# Patient Record
Sex: Female | Born: 1975
Health system: Southern US, Community
[De-identification: ages and names within clinical notes are randomized; demographics above are authoritative.]

## PROBLEM LIST (undated history)

## (undated) DIAGNOSIS — J302 Other seasonal allergic rhinitis: Secondary | ICD-10-CM

## (undated) DIAGNOSIS — D219 Benign neoplasm of connective and other soft tissue, unspecified: Secondary | ICD-10-CM

## (undated) DIAGNOSIS — Z8759 Personal history of other complications of pregnancy, childbirth and the puerperium: Secondary | ICD-10-CM

## (undated) HISTORY — DX: Benign neoplasm of connective and other soft tissue, unspecified: D21.9

## (undated) HISTORY — DX: Other seasonal allergic rhinitis: J30.2

## (undated) HISTORY — DX: Personal history of other complications of pregnancy, childbirth and the puerperium: Z87.59

---

## 2011-04-23 HISTORY — PX: MYOMECTOMY: SHX85

## 2013-04-22 HISTORY — PX: EYE SURGERY: SHX253

## 2015-09-30 DIAGNOSIS — Z87898 Personal history of other specified conditions: Secondary | ICD-10-CM | POA: Insufficient documentation

## 2015-09-30 DIAGNOSIS — IMO0001 Reserved for inherently not codable concepts without codable children: Secondary | ICD-10-CM | POA: Insufficient documentation

## 2015-09-30 DIAGNOSIS — R030 Elevated blood-pressure reading, without diagnosis of hypertension: Secondary | ICD-10-CM

## 2016-06-12 ENCOUNTER — Ambulatory Visit: Payer: Self-pay | Admitting: Family

## 2016-06-20 ENCOUNTER — Encounter: Payer: Self-pay | Admitting: Family

## 2016-06-20 ENCOUNTER — Ambulatory Visit (INDEPENDENT_AMBULATORY_CARE_PROVIDER_SITE_OTHER): Payer: Commercial Managed Care - PPO | Admitting: Family

## 2016-06-20 VITALS — BP 116/66 | HR 94 | Temp 98.6°F | Ht 65.5 in | Wt 177.2 lb

## 2016-06-20 DIAGNOSIS — R635 Abnormal weight gain: Secondary | ICD-10-CM

## 2016-06-20 DIAGNOSIS — D259 Leiomyoma of uterus, unspecified: Secondary | ICD-10-CM

## 2016-06-20 DIAGNOSIS — L709 Acne, unspecified: Secondary | ICD-10-CM | POA: Diagnosis not present

## 2016-06-20 NOTE — Progress Notes (Signed)
Subjective:    Patient ID: Tracy Ingram, female    DOB: 10/07/1975, 41 y.o.   MRN: [id number]  CC: Dzaria Scheckel is a 41 y.o. female who presents today to establish care.    HPI: Uterine fibriods- having heavy periods  Day one is heavy. Has some pain with intercourse based on positioning.  Has established with GYN in [facility]. Pap 2016, normal per patient.   Concerned with Weight gain- about 10 pounds since moved to [place]. Hasn't been excercising.   Mammogram normal per patient , unable to see records.   Acne- improving. Considering coming off of aldactone as making huge difference.  following with dermatology at [facility] in [place].     Will return for CPE, labs.    HISTORY:  No past medical history on file. Past Surgical History:  Procedure Laterality Date  . EYE SURGERY  2015   had nystagmus per patient  . MYOMECTOMY  2013   Family History  Problem Relation Age of Onset  . Fibroids Maternal Aunt   . Breast cancer Neg Hx   . Colon cancer Neg Hx     Allergies: Sulfa antibiotics No current outpatient prescriptions on file prior to visit.   No current facility-administered medications on file prior to visit.     Social History  Substance Use Topics  . Smoking status: Never Smoker  . Smokeless tobacco: Never Used  . Alcohol use Yes    Review of Systems  Constitutional: Negative for chills and fever.  Respiratory: Negative for cough.   Cardiovascular: Negative for chest pain and palpitations.  Gastrointestinal: Negative for nausea and vomiting.  Genitourinary: Positive for vaginal pain. Negative for vaginal discharge.      Objective:    BP 116/66   Pulse 94   Temp 98.6 F (37 C) (Oral)   Ht 5' 5.5" (1.664 m)   Wt 177 lb 3.2 oz (80.4 kg)   LMP 05/30/2016 (Exact Date)   SpO2 99%   BMI 29.04 kg/m  BP Readings from Last 3 Encounters:  06/20/16 116/66   Wt Readings from Last 3 Encounters:  06/20/16 177 lb 3.2 oz (80.4 kg)    Physical  Exam  Constitutional: She appears well-developed and well-nourished.  Eyes: Conjunctivae are normal.  Cardiovascular: Normal rate, regular rhythm, normal heart sounds and normal pulses.   Pulmonary/Chest: Effort normal and breath sounds normal. She has no wheezes. She has no rhonchi. She has no rales.  Neurological: She is alert.  Skin: Skin is warm and dry.  Psychiatric: She has a normal mood and affect. Her speech is normal and behavior is normal. Thought content normal.  Vitals reviewed.      Assessment & Plan:   Problem List Items Addressed This Visit      Musculoskeletal and Integument   Acne    Stable. Following with dermatology. Considering coming off of aldactone.       Relevant Medications   Dapsone 5 % topical gel     Genitourinary   Uterine leiomyoma    Chronic. Follows with GYN in [facility]. Discussed pursuing a transvaginal US to evaluate if fibriods have grown in size sine myomectomy.         Other   Weight gain - Primary    Gaining weight since move to . Discussed lifestyle  Modifications- exercise, low glycemic diet. Will follow.           I am having Ms. Mccathern maintain her tazarotene, Dapsone, and spironolactone.  Meds ordered this encounter  Medications  . tazarotene (TAZORAC) 0.1 % gel    Sig: Apply topically.  . Dapsone 5 % topical gel    Sig: Apply topically.  Marland Kitchen spironolactone (ALDACTONE) 25 MG tablet    Sig: Take 25 mg by mouth daily.    Return precautions given.   Risks, benefits, and alternatives of the medications and treatment plan prescribed today were discussed, and patient expressed understanding.   Education regarding symptom management and diagnosis given to patient on AVS.  Continue to follow with Mable Paris, FNP for routine health maintenance.   Tracy Ingram and I agreed with plan.   Mable Paris, FNP

## 2016-06-20 NOTE — Progress Notes (Signed)
Pre visit review using our clinic review tool, if applicable. No additional management support is needed unless otherwise documented below in the visit note. 

## 2016-06-20 NOTE — Patient Instructions (Addendum)
At check out, schedule physical with fasting  Labs.  Pleasure meeting you!  \

## 2016-06-24 ENCOUNTER — Encounter: Payer: Self-pay | Admitting: Family

## 2016-06-24 DIAGNOSIS — L709 Acne, unspecified: Secondary | ICD-10-CM | POA: Insufficient documentation

## 2016-06-24 DIAGNOSIS — R635 Abnormal weight gain: Secondary | ICD-10-CM | POA: Insufficient documentation

## 2016-06-24 DIAGNOSIS — D251 Intramural leiomyoma of uterus: Secondary | ICD-10-CM | POA: Insufficient documentation

## 2016-06-24 NOTE — Assessment & Plan Note (Addendum)
Chronic. Follows with GYN in [facility]. Discussed pursuing a transvaginal US to evaluate if fibriods have grown in size sine myomectomy.

## 2016-06-24 NOTE — Assessment & Plan Note (Signed)
Gaining weight since move to [place]. Discussed lifestyle  Modifications- exercise, low glycemic diet. Will follow.

## 2016-06-24 NOTE — Assessment & Plan Note (Signed)
Stable. Following with dermatology. Considering coming off of aldactone.

## 2016-08-13 ENCOUNTER — Encounter: Payer: Self-pay | Admitting: Family

## 2016-08-15 ENCOUNTER — Encounter: Payer: Self-pay | Admitting: *Deleted

## 2016-08-15 ENCOUNTER — Ambulatory Visit (INDEPENDENT_AMBULATORY_CARE_PROVIDER_SITE_OTHER): Payer: 59 | Admitting: *Deleted

## 2016-08-15 DIAGNOSIS — Z3201 Encounter for pregnancy test, result positive: Secondary | ICD-10-CM

## 2016-08-15 NOTE — Progress Notes (Signed)
Patient presents to clinic for pregnancy test which is positive. She wants to start care here. Reviewed allergies and meds. Advised to start prenatal vitamins, eat healthy, no smoking drugs or alcohol. Patient voices understanding. To front desk to make initial prenatal appointment.

## 2016-08-16 LAB — POCT PREGNANCY, URINE: Preg Test, Ur: POSITIVE — AB

## 2016-08-19 ENCOUNTER — Encounter: Payer: Self-pay | Admitting: General Practice

## 2016-09-03 ENCOUNTER — Encounter: Payer: Self-pay | Admitting: Emergency Medicine

## 2016-09-03 ENCOUNTER — Emergency Department
Admission: EM | Admit: 2016-09-03 | Discharge: 2016-09-03 | Disposition: A | Discharge status: Home / Self Care | Payer: 59 | Attending: Emergency Medicine | Admitting: Emergency Medicine

## 2016-09-03 ENCOUNTER — Emergency Department: Payer: 59

## 2016-09-03 ENCOUNTER — Encounter: Payer: Self-pay | Admitting: Family

## 2016-09-03 DIAGNOSIS — O039 Complete or unspecified spontaneous abortion without complication: Secondary | ICD-10-CM | POA: Insufficient documentation

## 2016-09-03 DIAGNOSIS — N898 Other specified noninflammatory disorders of vagina: Secondary | ICD-10-CM | POA: Diagnosis present

## 2016-09-03 DIAGNOSIS — O209 Hemorrhage in early pregnancy, unspecified: Secondary | ICD-10-CM | POA: Diagnosis not present

## 2016-09-03 DIAGNOSIS — O208 Other hemorrhage in early pregnancy: Secondary | ICD-10-CM | POA: Diagnosis not present

## 2016-09-03 LAB — BASIC METABOLIC PANEL
ANION GAP: 7 (ref 5–15)
BUN: 8 mg/dL (ref 6–20)
CO2: 23 mmol/L (ref 22–32)
Calcium: 8.4 mg/dL — ABNORMAL LOW (ref 8.9–10.3)
Chloride: 104 mmol/L (ref 101–111)
Creatinine, Ser: 0.58 mg/dL (ref 0.44–1.00)
GFR calc non Af Amer: >60 mL/min (ref >60)
Glucose, Bld: 102 mg/dL — ABNORMAL HIGH (ref 65–99)
Potassium: 3.6 mmol/L (ref 3.5–5.1)
Sodium: 134 mmol/L — ABNORMAL LOW (ref 135–145)

## 2016-09-03 LAB — CBC
HCT: 34.8 % — ABNORMAL LOW (ref 35.0–47.0)
HEMOGLOBIN: 11.7 g/dL — AB (ref 12.0–16.0)
MCH: 27.1 pg (ref 26.0–34.0)
MCHC: 33.7 g/dL (ref 32.0–36.0)
MCV: 80.4 fL (ref 80.0–100.0)
Platelets: 253 10*3/uL (ref 150–440)
RBC: 4.33 MIL/uL (ref 3.80–5.20)
RDW: 15.2 % — ABNORMAL HIGH (ref 11.5–14.5)
WBC: 8.3 10*3/uL (ref 3.6–11.0)

## 2016-09-03 LAB — ANTIBODY SCREEN: ANTIBODY SCREEN: NEGATIVE

## 2016-09-03 LAB — HCG, QUANTITATIVE, PREGNANCY: hCG, Beta Chain, Quant, S: 18577 m[IU]/mL — ABNORMAL HIGH (ref <5)

## 2016-09-03 LAB — POCT PREGNANCY, URINE: Preg Test, Ur: POSITIVE — AB

## 2016-09-03 LAB — ABO/RH: ABO/RH(D): O NEG

## 2016-09-03 MED ORDER — RHO D IMMUNE GLOBULIN 1500 UNIT/2ML IJ SOSY
300.0000 ug | PREFILLED_SYRINGE | Freq: Once | INTRAMUSCULAR | Status: AC
  Administered 2016-09-03: 300 ug via INTRAMUSCULAR
  Filled 2016-09-03: qty 2

## 2016-09-03 NOTE — ED Provider Notes (Signed)
[facility] Emergency Department Provider Note  ____________________________________________  Time seen: Approximately 7:57 AM  I have reviewed the triage vital signs and the nursing notes.   HISTORY  Chief Complaint Vaginal Bleeding   HPI Lorian Yaun is a 41 y.o. female G1P0 at 10 weeks by LMP with h/o uterine fibroids status post myomectomy who presents for evaluation of vaginal bleeding. Patient reports that although she has established care for this pregnancy she has not undergone an ultrasound yet. Today at 2 AM she started having vaginal bleeding and passing very large clots. She denies dizziness, chest pain or shortness of breath. She does endorse lower intermittent mild abdominal cramping associated with the vaginal bleeding. This is patient's first pregnancy. Patient denies any history of bleeding disorder.  History reviewed. No pertinent past medical history.  Patient Active Problem List   Diagnosis Date Noted  . Uterine leiomyoma 06/24/2016  . Acne 06/24/2016  . Weight gain 06/24/2016    Past Surgical History:  Procedure Laterality Date  . EYE SURGERY  2015   had nystagmus per patient  . MYOMECTOMY  2013    Prior to Admission medications   Medication Sig Start Date End Date Taking? Authorizing Provider  Dapsone 5 % topical gel Apply topically.    [provider]  spironolactone (ALDACTONE) 25 MG tablet Take 25 mg by mouth daily.    [provider]  tazarotene (TAZORAC) 0.1 % gel Apply topically.    [provider]    Allergies Sulfa antibiotics  Family History  Problem Relation Age of Onset  . Fibroids Maternal Aunt   . Breast cancer Neg Hx   . Colon cancer Neg Hx     Social History Social History  Substance Use Topics  . Smoking status: Never Smoker  . Smokeless tobacco: Never Used  . Alcohol use Yes    Review of Systems  Constitutional: Negative for fever. Eyes: Negative for visual  changes. ENT: Negative for sore throat. Neck: No neck pain  Cardiovascular: Negative for chest pain. Respiratory: Negative for shortness of breath. Gastrointestinal: + lower abdominal cramping. No vomiting or diarrhea. Genitourinary: Negative for dysuria. + vaginal bleeding Musculoskeletal: Negative for back pain. Skin: Negative for rash. Neurological: Negative for headaches, weakness or numbness. Psych: No SI or HI  ____________________________________________   PHYSICAL EXAM:  VITAL SIGNS: ED Triage Vitals  Enc Vitals Group     BP 09/03/16 0649 (!) 159/82     Pulse Rate 09/03/16 0649 97     Resp 09/03/16 0649 18     Temp 09/03/16 0649 97.9 F (36.6 C)     Temp Source 09/03/16 0649 Oral     SpO2 09/03/16 0649 99 %     Weight --      Height 09/03/16 0650 5\' 6"  (1.676 m)     Head Circumference --      Peak Flow --      Pain Score 09/03/16 0649 2     Pain Loc --      Pain Edu? --      Excl. in [place]? --     Constitutional: Alert and oriented. Well appearing and in no apparent distress. HEENT:      Head: Normocephalic and atraumatic.         Eyes: Conjunctivae are normal. Sclera is non-icteric. EOMI. PERRL      Mouth/Throat: Mucous membranes are moist.       Neck: Supple with no signs of meningismus. Cardiovascular: Regular rate  and rhythm. No murmurs, gallops, or rubs. 2+ symmetrical distal pulses are present in all extremities. No JVD. Respiratory: Normal respiratory effort. Lungs are clear to auscultation bilaterally. No wheezes, crackles, or rhonchi.  Gastrointestinal: Soft, non tender, and non distended with positive bowel sounds. No rebound or guarding. Genitourinary: No CVA tenderness.  Pelvic exam: Normal external genitalia, no rashes or lesions. Large fibroid felt on the anterior portion of the uterus. Os closed with small amount of blood in the vaginal vault. No cervical motion tenderness.  No uterine or adnexal tenderness.   Musculoskeletal: Nontender with normal  range of motion in all extremities. No edema, cyanosis, or erythema of extremities. Neurologic: Normal speech and language. Face is symmetric. Moving all extremities. No gross focal neurologic deficits are appreciated. Skin: Skin is warm, dry and intact. No rash noted. Psychiatric: Mood and affect are normal. Speech and behavior are normal.  ____________________________________________   LABS (all labs ordered are listed, but only abnormal results are displayed)  Labs Reviewed  HCG, QUANTITATIVE, PREGNANCY - Abnormal; Notable for the following:       Result Value   hCG, Beta Chain, Quant, S 18,577 (*)    All other components within normal limits  CBC - Abnormal; Notable for the following:    Hemoglobin 11.7 (*)    HCT 34.8 (*)    RDW 15.2 (*)    All other components within normal limits  BASIC METABOLIC PANEL - Abnormal; Notable for the following:    Sodium 134 (*)    Glucose, Bld 102 (*)    Calcium 8.4 (*)    All other components within normal limits  POCT PREGNANCY, URINE - Abnormal; Notable for the following:    Preg Test, Ur POSITIVE (*)    All other components within normal limits  POC URINE PREG, ED  ABO/RH  ANTIBODY SCREEN  RHOGAM INJECTION   ____________________________________________  EKG  none  ____________________________________________  RADIOLOGY  TVUS: No intrauterine pregnancy identified.  2. Endometrial thickening is noted. Fibroid uterus. ____________________________________________   PROCEDURES  Procedure(s) performed: None Procedures Critical Care performed:  None ____________________________________________   INITIAL IMPRESSION / ASSESSMENT AND PLAN / ED COURSE  41 y.o. female G1P0 at 10 weeks by LMP with h/o uterine fibroids status post myomectomy who presents for evaluation of vaginal bleeding since 2 AM. Differential diagnosis including miscarriage versus bleeding from first trimester. We'll pursue an ultrasound. Hemoglobin 11.7  (baseline 12.4 from 09/2015), vitals stable. Blood type O-, will give Rhogam.     _________________________ 10:10 AM on 09/03/2016 ----------------------------------------- Ultrasound confirmed a complete miscarriage. Patient remains HD stable. Received RhoGAM. Discussed signs and symptoms of acute blood loss anemia and recommended the patient returns to the emergency room if these develop. Pelvic exam showing closed os with small amount of blood in the vaginal vault. Patient has a follow-up appointment with her PCP tomorrow that she plans on keeping it.   Pertinent labs & imaging results that were available during my care of the patient were reviewed by me and considered in my medical decision making (see chart for details).    ____________________________________________   FINAL CLINICAL IMPRESSION(S) / ED DIAGNOSES  Final diagnoses:  Complete miscarriage      NEW MEDICATIONS STARTED DURING THIS VISIT:  New Prescriptions   No medications on file     Note:  This document was prepared using Dragon voice recognition software and may include unintentional dictation errors.    Alfred Levins, [place], MD 09/03/16 1011

## 2016-09-03 NOTE — ED Notes (Signed)
TO US via stretcher.  AAOx3.  Skin warm and dry.  

## 2016-09-03 NOTE — ED Notes (Signed)
AAOx3.  Skin warm and dry.  NAD 

## 2016-09-03 NOTE — ED Triage Notes (Signed)
Patient ambulatory to triage with steady gait, without difficulty or distress noted; pt reports approx [redacted]wks pregnant; pt at [facility] in [place]; G1, [facility] 12/12; reports vag bleeding at 2am upon awakening; st bleeding heavy and has passed 2 large clots with lower abd cramping that began last night

## 2016-09-03 NOTE — ED Notes (Signed)
Patient denies pain and is resting comfortably.  

## 2016-09-04 ENCOUNTER — Encounter: Payer: Self-pay | Admitting: Family

## 2016-09-04 ENCOUNTER — Ambulatory Visit (INDEPENDENT_AMBULATORY_CARE_PROVIDER_SITE_OTHER): Payer: 59 | Admitting: Family

## 2016-09-04 VITALS — BP 126/68 | HR 94 | Temp 98.7°F | Ht 66.0 in | Wt 176.4 lb

## 2016-09-04 DIAGNOSIS — Z0000 Encounter for general adult medical examination without abnormal findings: Secondary | ICD-10-CM | POA: Insufficient documentation

## 2016-09-04 LAB — LIPID PANEL
CHOLESTEROL: 137 mg/dL (ref 0–200)
HDL: 64.7 mg/dL (ref >39.00)
LDL CALC: 64 mg/dL (ref 0–99)
NonHDL: 72.63
Total CHOL/HDL Ratio: 2
Triglycerides: 44 mg/dL (ref 0.0–149.0)
VLDL: 8.8 mg/dL (ref 0.0–40.0)

## 2016-09-04 LAB — COMPREHENSIVE METABOLIC PANEL
ALBUMIN: 3.8 g/dL (ref 3.5–5.2)
ALT: 8 U/L (ref 0–35)
AST: 11 U/L (ref 0–37)
Alkaline Phosphatase: 33 U/L — ABNORMAL LOW (ref 39–117)
BILIRUBIN TOTAL: 0.3 mg/dL (ref 0.2–1.2)
BUN: 9 mg/dL (ref 6–23)
CO2: 29 mEq/L (ref 19–32)
CREATININE: 0.71 mg/dL (ref 0.40–1.20)
Calcium: 8.8 mg/dL (ref 8.4–10.5)
Chloride: 106 mEq/L (ref 96–112)
GFR: 116.92 mL/min (ref >60.00)
Glucose, Bld: 97 mg/dL (ref 70–99)
Potassium: 4 mEq/L (ref 3.5–5.1)
Sodium: 139 mEq/L (ref 135–145)
Total Protein: 6.5 g/dL (ref 6.0–8.3)

## 2016-09-04 LAB — CBC WITH DIFFERENTIAL/PLATELET
BASOS PCT: 0.4 % (ref 0.0–3.0)
Basophils Absolute: 0 10*3/uL (ref 0.0–0.1)
EOS PCT: 2.4 % (ref 0.0–5.0)
Eosinophils Absolute: 0.2 10*3/uL (ref 0.0–0.7)
HEMATOCRIT: 31.6 % — AB (ref 36.0–46.0)
HEMOGLOBIN: 10.5 g/dL — AB (ref 12.0–15.0)
LYMPHS PCT: 16.7 % (ref 12.0–46.0)
Lymphs Abs: 1.7 10*3/uL (ref 0.7–4.0)
MCHC: 33.2 g/dL (ref 30.0–36.0)
MCV: 81.6 fl (ref 78.0–100.0)
Monocytes Absolute: 0.7 10*3/uL (ref 0.1–1.0)
Monocytes Relative: 6.5 % (ref 3.0–12.0)
NEUTROS ABS: 7.6 10*3/uL (ref 1.4–7.7)
Neutrophils Relative %: 74 % (ref 43.0–77.0)
Platelets: 236 10*3/uL (ref 150.0–400.0)
RBC: 3.87 Mil/uL (ref 3.87–5.11)
RDW: 15.6 % — ABNORMAL HIGH (ref 11.5–15.5)
WBC: 10.3 10*3/uL (ref 4.0–10.5)

## 2016-09-04 LAB — RHOGAM INJECTION: UNIT DIVISION: 0

## 2016-09-04 LAB — TSH: TSH: 0.73 u[IU]/mL (ref 0.35–4.50)

## 2016-09-04 LAB — VITAMIN D 25 HYDROXY (VIT D DEFICIENCY, FRACTURES): VITD: 28.08 ng/mL — AB (ref 30.00–100.00)

## 2016-09-04 LAB — HEMOGLOBIN A1C: HEMOGLOBIN A1C: 5.7 % (ref 4.6–6.5)

## 2016-09-04 NOTE — Assessment & Plan Note (Signed)
Will do pap at later date due to miscarriage. Labs today. Encouraged exercise.

## 2016-09-04 NOTE — Progress Notes (Signed)
Pre visit review using our clinic review tool, if applicable. No additional management support is needed unless otherwise documented below in the visit note. 

## 2016-09-04 NOTE — Progress Notes (Signed)
Subjective:    Patient ID: Tracy Ingram, female    DOB: 08-08-1975, 41 y.o.   MRN: [id number]  CC: Keyon Winnick is a 41 y.o. female who presents today for physical exam.    HPI: Overall well today.   Was in the ED yesterday for miscarriage at 10 weeks. Will follow with OB GYN in [facility].   H/o fibroids.     Colorectal Cancer Screening: No family h/o colon cancer Breast Cancer Screening: Mammogram 04/2016 ; normal.  Cervical Cancer Screening: UTD; declines; had a miscarriage yesterday. Will come back or follow up with OB.  Bone Health screening/DEXA for 65+: No increased fracture risk. Defer screening at this time. Lung Cancer Screening: Doesn't have 30 year pack year history and age > 60 years.       Tetanus - utd per  Patient 06/2016         HIV Screening- Candidate for , consents Labs: Screening labs today. Exercise: Gets regular exercise.  Alcohol use: very rare Smoking/tobacco use: Nonsmoker.  Regular dental exams: UTD Wears seat belt: Yes. Skin: no new skin lesions. No h/o skin cancer. Follows with derm.  HISTORY:  No past medical history on file.  Past Surgical History:  Procedure Laterality Date  . EYE SURGERY  2015   had nystagmus per patient  . MYOMECTOMY  2013   Family History  Problem Relation Age of Onset  . Fibroids Maternal Aunt   . Breast cancer Neg Hx   . Colon cancer Neg Hx       ALLERGIES: Sulfa antibiotics  No current outpatient prescriptions on file prior to visit.   No current facility-administered medications on file prior to visit.     Social History  Substance Use Topics  . Smoking status: Never Smoker  . Smokeless tobacco: Never Used  . Alcohol use Yes    Review of Systems  Constitutional: Negative for chills, fever and unexpected weight change.  HENT: Negative for congestion.   Respiratory: Negative for cough.   Cardiovascular: Negative for chest pain, palpitations and leg swelling.  Gastrointestinal:  Negative for nausea and vomiting.  Musculoskeletal: Negative for arthralgias and myalgias.  Skin: Negative for rash.  Neurological: Negative for headaches.  Hematological: Negative for adenopathy.  Psychiatric/Behavioral: Negative for confusion.      Objective:    BP 126/68   Pulse 94   Temp 98.7 F (37.1 C) (Oral)   Ht 5\' 6"  (1.676 m)   Wt 176 lb 6.4 oz (80 kg)   LMP 06/25/2016 Comment: mis carriage [id number]  SpO2 98%   Breastfeeding? No   BMI 28.47 kg/m   BP Readings from Last 3 Encounters:  09/04/16 126/68  09/03/16 (!) 160/80  06/20/16 116/66   Wt Readings from Last 3 Encounters:  09/04/16 176 lb 6.4 oz (80 kg)  06/20/16 177 lb 3.2 oz (80.4 kg)    Physical Exam  Constitutional: She appears well-developed and well-nourished.  Eyes: Conjunctivae are normal.  Neck: No thyroid mass and no thyromegaly present.  Cardiovascular: Normal rate, regular rhythm, normal heart sounds and normal pulses.   Pulmonary/Chest: Effort normal and breath sounds normal. She has no wheezes. She has no rhonchi. She has no rales. Right breast exhibits no inverted nipple, no mass, no nipple discharge, no skin change and no tenderness. Left breast exhibits no inverted nipple, no mass, no nipple discharge, no skin change and no tenderness. Breasts are symmetrical.  CBE performed.   Lymphadenopathy:  Head (right side): No submental, no submandibular, no tonsillar, no preauricular, no posterior auricular and no occipital adenopathy present.       Head (left side): No submental, no submandibular, no tonsillar, no preauricular, no posterior auricular and no occipital adenopathy present.    She has no cervical adenopathy.       Right cervical: No superficial cervical, no deep cervical and no posterior cervical adenopathy present.      Left cervical: No superficial cervical, no deep cervical and no posterior cervical adenopathy present.    She has no axillary adenopathy.  Neurological: She is  alert.  Skin: Skin is warm and dry.  Psychiatric: She has a normal mood and affect. Her speech is normal and behavior is normal. Thought content normal.  Vitals reviewed.      Assessment & Plan:   Problem List Items Addressed This Visit      Other   Routine physical examination - Primary    Will do pap at later date due to miscarriage. Labs today. Encouraged exercise.       Relevant Orders   CBC with Differential/Platelet   Comprehensive metabolic panel   Hemoglobin A1c   HIV antibody   Lipid panel   VITAMIN D 25 Hydroxy (Vit-D Deficiency, Fractures)   TSH       I have discontinued Ms. Beckom's tazarotene, Dapsone, and spironolactone.   No orders of the defined types were placed in this encounter.   Return precautions given.   Risks, benefits, and alternatives of the medications and treatment plan prescribed today were discussed, and patient expressed understanding.   Education regarding symptom management and diagnosis given to patient on AVS.   Continue to follow with Burnard Hawthorne, FNP for routine health maintenance.   Tracy Ingram and I agreed with plan.   Mable Paris, FNP

## 2016-09-04 NOTE — Patient Instructions (Addendum)
Dr Enzo Bi- [facility]  Ensure you either come back to me or your OB for pap smear as you are due.   Labs today    Health Maintenance, Female Adopting a healthy lifestyle and getting preventive care can go a long way to promote health and wellness. Talk with your health care provider about what schedule of regular examinations is right for you. This is a good chance for you to check in with your provider about disease prevention and staying healthy. In between checkups, there are plenty of things you can do on your own. Experts have done a lot of research about which lifestyle changes and preventive measures are most likely to keep you healthy. Ask your health care provider for more information. Weight and diet Eat a healthy diet  Be sure to include plenty of vegetables, fruits, low-fat dairy products, and lean protein.  Do not eat a lot of foods high in solid fats, added sugars, or salt.  Get regular exercise. This is one of the most important things you can do for your health.  Most adults should exercise for at least 150 minutes each week. The exercise should increase your heart rate and make you sweat (moderate-intensity exercise).  Most adults should also do strengthening exercises at least twice a week. This is in addition to the moderate-intensity exercise. Maintain a healthy weight  Body mass index (BMI) is a measurement that can be used to identify possible weight problems. It estimates body fat based on height and weight. Your health care provider can help determine your BMI and help you achieve or maintain a healthy weight.  For females 81 years of age and older:  A BMI below 18.5 is considered underweight.  A BMI of 18.5 to 24.9 is normal.  A BMI of 25 to 29.9 is considered overweight.  A BMI of 30 and above is considered obese. Watch levels of cholesterol and blood lipids  You should start having your blood tested for lipids and cholesterol at 41  years of age, then have this test every 5 years.  You may need to have your cholesterol levels checked more often if:  Your lipid or cholesterol levels are high.  You are older than 41 years of age.  You are at high risk for heart disease. Cancer screening Lung Cancer  Lung cancer screening is recommended for adults 42-19 years old who are at high risk for lung cancer because of a history of smoking.  A yearly low-dose CT scan of the lungs is recommended for people who:  Currently smoke.  Have quit within the past 15 years.  Have at least a 30-pack-year history of smoking. A pack year is smoking an average of one pack of cigarettes a day for 1 year.  Yearly screening should continue until it has been 15 years since you quit.  Yearly screening should stop if you develop a health problem that would prevent you from having lung cancer treatment. Breast Cancer  Practice breast self-awareness. This means understanding how your breasts normally appear and feel.  It also means doing regular breast self-exams. Let your health care provider know about any changes, no matter how small.  If you are in your 20s or 30s, you should have a clinical breast exam (CBE) by a health care provider every 1-3 years as part of a regular health exam.  If you are 83 or older, have a CBE every year. Also consider having a breast X-ray (mammogram) every year.  If you have a family history of breast cancer, talk to your health care provider about genetic screening.  If you are at high risk for breast cancer, talk to your health care provider about having an MRI and a mammogram every year.  Breast cancer gene (BRCA) assessment is recommended for women who have family members with BRCA-related cancers. BRCA-related cancers include:  Breast.  Ovarian.  Tubal.  Peritoneal cancers.  Results of the assessment will determine the need for genetic counseling and BRCA1 and BRCA2 testing. Cervical Cancer   Your health care provider may recommend that you be screened regularly for cancer of the pelvic organs (ovaries, uterus, and vagina). This screening involves a pelvic examination, including checking for microscopic changes to the surface of your cervix (Pap test). You may be encouraged to have this screening done every 3 years, beginning at age 47.  For women ages 55-65, health care providers may recommend pelvic exams and Pap testing every 3 years, or they may recommend the Pap and pelvic exam, combined with testing for human papilloma virus (HPV), every 5 years. Some types of HPV increase your risk of cervical cancer. Testing for HPV may also be done on women of any age with unclear Pap test results.  Other health care providers may not recommend any screening for nonpregnant women who are considered low risk for pelvic cancer and who do not have symptoms. Ask your health care provider if a screening pelvic exam is right for you.  If you have had past treatment for cervical cancer or a condition that could lead to cancer, you need Pap tests and screening for cancer for at least 20 years after your treatment. If Pap tests have been discontinued, your risk factors (such as having a new sexual partner) need to be reassessed to determine if screening should resume. Some women have medical problems that increase the chance of getting cervical cancer. In these cases, your health care provider may recommend more frequent screening and Pap tests. Colorectal Cancer  This type of cancer can be detected and often prevented.  Routine colorectal cancer screening usually begins at 41 years of age and continues through 41 years of age.  Your health care provider may recommend screening at an earlier age if you have risk factors for colon cancer.  Your health care provider may also recommend using home test kits to check for hidden blood in the stool.  A small camera at the end of a tube can be used to examine  your colon directly (sigmoidoscopy or colonoscopy). This is done to check for the earliest forms of colorectal cancer.  Routine screening usually begins at age 63.  Direct examination of the colon should be repeated every 5-10 years through 41 years of age. However, you may need to be screened more often if early forms of precancerous polyps or small growths are found. Skin Cancer  Check your skin from head to toe regularly.  Tell your health care provider about any new moles or changes in moles, especially if there is a change in a mole's shape or color.  Also tell your health care provider if you have a mole that is larger than the size of a Larock eraser.  Always use sunscreen. Apply sunscreen liberally and repeatedly throughout the day.  Protect yourself by wearing long sleeves, pants, a wide-brimmed hat, and sunglasses whenever you are outside. Heart disease, diabetes, and high blood pressure  High blood pressure causes heart disease and increases the risk  of stroke. High blood pressure is more likely to develop in:  People who have blood pressure in the high end of the normal range (130-139/85-89 mm Hg).  People who are overweight or obese.  People who are African American.  If you are 24-83 years of age, have your blood pressure checked every 3-5 years. If you are 53 years of age or older, have your blood pressure checked every year. You should have your blood pressure measured twice-once when you are at a hospital or clinic, and once when you are not at a hospital or clinic. Record the average of the two measurements. To check your blood pressure when you are not at a hospital or clinic, you can use:  An automated blood pressure machine at a pharmacy.  A home blood pressure monitor.  If you are between 15 years and 54 years old, ask your health care provider if you should take aspirin to prevent strokes.  Have regular diabetes screenings. This involves taking a blood sample  to check your fasting blood sugar level.  If you are at a normal weight and have a low risk for diabetes, have this test once every three years after 41 years of age.  If you are overweight and have a high risk for diabetes, consider being tested at a younger age or more often. Preventing infection Hepatitis B  If you have a higher risk for hepatitis B, you should be screened for this virus. You are considered at high risk for hepatitis B if:  You were born in a country where hepatitis B is common. Ask your health care provider which countries are considered high risk.  Your parents were born in a high-risk country, and you have not been immunized against hepatitis B (hepatitis B vaccine).  You have HIV or AIDS.  You use needles to inject street drugs.  You live with someone who has hepatitis B.  You have had sex with someone who has hepatitis B.  You get hemodialysis treatment.  You take certain medicines for conditions, including cancer, organ transplantation, and autoimmune conditions. Hepatitis C  Blood testing is recommended for:  Everyone born from [birth year] through 1965.  Anyone with known risk factors for hepatitis C. Sexually transmitted infections (STIs)  You should be screened for sexually transmitted infections (STIs) including gonorrhea and chlamydia if:  You are sexually active and are younger than 41 years of age.  You are older than 41 years of age and your health care provider tells you that you are at risk for this type of infection.  Your sexual activity has changed since you were last screened and you are at an increased risk for chlamydia or gonorrhea. Ask your health care provider if you are at risk.  If you do not have HIV, but are at risk, it may be recommended that you take a prescription medicine daily to prevent HIV infection. This is called pre-exposure prophylaxis (PrEP). You are considered at risk if:  You are sexually active and do not regularly  use condoms or know the HIV status of your partner(s).  You take drugs by injection.  You are sexually active with a partner who has HIV. Talk with your health care provider about whether you are at high risk of being infected with HIV. If you choose to begin PrEP, you should first be tested for HIV. You should then be tested every 3 months for as long as you are taking PrEP. Pregnancy  If you are  premenopausal and you may become pregnant, ask your health care provider about preconception counseling.  If you may become pregnant, take 400 to 800 micrograms (mcg) of folic acid every day.  If you want to prevent pregnancy, talk to your health care provider about birth control (contraception). Osteoporosis and menopause  Osteoporosis is a disease in which the bones lose minerals and strength with aging. This can result in serious bone fractures. Your risk for osteoporosis can be identified using a bone density scan.  If you are 58 years of age or older, or if you are at risk for osteoporosis and fractures, ask your health care provider if you should be screened.  Ask your health care provider whether you should take a calcium or vitamin D supplement to lower your risk for osteoporosis.  Menopause may have certain physical symptoms and risks.  Hormone replacement therapy may reduce some of these symptoms and risks. Talk to your health care provider about whether hormone replacement therapy is right for you. Follow these instructions at home:  Schedule regular health, dental, and eye exams.  Stay current with your immunizations.  Do not use any tobacco products including cigarettes, chewing tobacco, or electronic cigarettes.  If you are pregnant, do not drink alcohol.  If you are breastfeeding, limit how much and how often you drink alcohol.  Limit alcohol intake to no more than 1 drink per day for nonpregnant women. One drink equals 12 ounces of beer, 5 ounces of wine, or 1 ounces of  hard liquor.  Do not use street drugs.  Do not share needles.  Ask your health care provider for help if you need support or information about quitting drugs.  Tell your health care provider if you often feel depressed.  Tell your health care provider if you have ever been abused or do not feel safe at home. This information is not intended to replace advice given to you by your health care provider. Make sure you discuss any questions you have with your health care provider. Document Released: 10/22/2010 Document Revised: 09/14/2015 Document Reviewed: 01/10/2015 Elsevier Interactive Patient Education  2017 [employer].

## 2016-09-05 LAB — HIV ANTIBODY (ROUTINE TESTING W REFLEX): HIV 1&2 Ab, 4th Generation: NONREACTIVE

## 2016-09-19 ENCOUNTER — Encounter: Payer: 59 | Admitting: Obstetrics & Gynecology

## 2016-09-20 ENCOUNTER — Encounter: Payer: Self-pay | Admitting: Obstetrics and Gynecology

## 2016-09-26 DIAGNOSIS — D225 Melanocytic nevi of trunk: Secondary | ICD-10-CM | POA: Diagnosis not present

## 2016-09-26 DIAGNOSIS — L700 Acne vulgaris: Secondary | ICD-10-CM | POA: Diagnosis not present

## 2016-10-08 ENCOUNTER — Ambulatory Visit (INDEPENDENT_AMBULATORY_CARE_PROVIDER_SITE_OTHER): Payer: 59 | Admitting: Obstetrics and Gynecology

## 2016-10-08 ENCOUNTER — Encounter: Payer: Self-pay | Admitting: Obstetrics and Gynecology

## 2016-10-08 VITALS — BP 120/78 | HR 71 | Ht 66.0 in | Wt 175.6 lb

## 2016-10-08 DIAGNOSIS — D259 Leiomyoma of uterus, unspecified: Secondary | ICD-10-CM | POA: Diagnosis not present

## 2016-10-08 DIAGNOSIS — Z8759 Personal history of other complications of pregnancy, childbirth and the puerperium: Secondary | ICD-10-CM | POA: Diagnosis not present

## 2016-10-08 DIAGNOSIS — Z01419 Encounter for gynecological examination (general) (routine) without abnormal findings: Secondary | ICD-10-CM

## 2016-10-08 DIAGNOSIS — Z87898 Personal history of other specified conditions: Secondary | ICD-10-CM | POA: Diagnosis not present

## 2016-10-08 DIAGNOSIS — Z7689 Persons encountering health services in other specified circumstances: Secondary | ICD-10-CM | POA: Diagnosis not present

## 2016-10-08 NOTE — Progress Notes (Signed)
GYNECOLOGY ANNUAL PHYSICAL EXAM PROGRESS NOTE  Subjective:    Tracy Ingram is a 41 y.o. G51P0010 female who presents to establish care, and for a routine gynecologic examination.  The patient was referred from [facility].  She has relocated from [place] ~ 1 year ago, and has not yet established GYN care. The patient has no complaints today. The patient is sexually active.  The patient wears seatbelts: yes. The patient participates in regular exercise: yes (3 x weekly). Has the patient ever been transfused or tattooed?: no. The patient reports that there is not domestic violence in her life.   The patient notes the following today: 1.  Recent history of miscarriage, occurred last month.  Patient was previously not on any contraception. Was seen in the Emergency Room due to vaginal bleeding and passage of clots. Was ~ [redacted] weeks pregnant. This is what prompted her PCP to refer patient to a GYN.  Gynecologic History  Menarche age: 10 Patient's last menstrual period was 09/28/2016. Period Cycle (Days): 28 Period Duration (Days): 5-7 Menstrual Flow: Moderate, Heavy Menstrual Control: Maxi pad Dysmenorrhea: (!) Mild Dysmenorrhea Symptoms: Cramping, Other (Comment)  Contraception: none History of STI's: Denies Last Pap: 2 years ago. Results were: normal.  Notes h/o abnormal pap smears. Last mammogram: 04/2016. Results were: normal   Obstetric History   G1   P0   T0   P0   A1   L0    SAB1   TAB0   Ectopic0   Multiple0   Live Births0     # Outcome Date GA Lbr Len/2nd Weight Sex Delivery Anes PTL Lv  1 SAB 08/2016 [redacted]w[redacted]d    SAB         Past Medical History:  Diagnosis Date  . Fibroid   . History of miscarriage   . Seasonal allergies     Past Surgical History:  Procedure Laterality Date  . EYE SURGERY  2015   had nystagmus per patient  . MYOMECTOMY  2013    Family History  Problem Relation Age of Onset  . Fibroids Maternal Aunt   . Thyroid disease Mother   .  Breast cancer Neg Hx   . Colon cancer Neg Hx   . Ovarian cancer Neg Hx     Social History   Social History  . Marital status: Married    Spouse name: N/A  . Number of children: N/A  . Years of education: N/A   Occupational History  . Not on file.   Social History Main Topics  . Smoking status: Never Smoker  . Smokeless tobacco: Never Used  . Alcohol use Yes     Comment: rare  . Drug use: No  . Sexual activity: Yes    Birth control/ protection: None   Other Topics Concern  . Not on file   Social History Narrative   From [place]       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   No current facility-administered medications on file prior to visit.     Allergies  Allergen Reactions  . Sulfa Antibiotics     Review of Systems Constitutional: negative for chills, fatigue, fevers and sweats Eyes: negative for irritation, redness and visual disturbance Ears, nose, mouth, throat, and face:  negative for hearing loss, nasal congestion, snoring and tinnitus Respiratory: negative for asthma, cough, sputum Cardiovascular: negative for chest pain, dyspnea, exertional chest pressure/discomfort, irregular heart beat, palpitations and syncope Gastrointestinal: negative for abdominal pain, change in bowel habits, nausea and vomiting Genitourinary: occasional twinge of pain in left lower pelvis. Negative for abnormal menstrual periods, genital lesions, sexual problems and vaginal discharge, dysuria and urinary incontinence Integument/breast: negative for breast lump, breast tenderness and nipple discharge Hematologic/lymphatic: negative for bleeding and easy bruising Musculoskeletal:negative for back pain and muscle weakness Neurological: negative for dizziness, headaches, vertigo and weakness Endocrine: negative for diabetic symptoms including polydipsia, polyuria and  skin dryness Allergic/Immunologic: negative for hay fever and urticaria       Objective:  Blood pressure 120/78, pulse 71, height 5\' 6"  (1.676 m), weight 175 lb 9.6 oz (79.7 kg), last menstrual period 09/28/2016. Body mass index is 28.34 kg/m.     General Appearance:    Alert, cooperative, no distress, appears stated age  Head:    Normocephalic, without obvious abnormality, atraumatic  Eyes:    PERRL, conjunctiva/corneas clear, EOM's intact, both eyes  Ears:    Normal external ear canals, both ears  Nose:   Nares normal, septum midline, mucosa normal, no drainage or sinus tenderness  Throat:   Lips, mucosa, and tongue normal; teeth and gums normal  Neck:   Supple, symmetrical, trachea midline, no adenopathy; thyroid: no enlargement/tenderness/nodules; no carotid bruit or JVD  Back:     Symmetric, no curvature, ROM normal, no CVA tenderness  Lungs:     Clear to auscultation bilaterally, respirations unlabored  Chest Wall:    No tenderness or deformity   Heart:    Regular rate and rhythm, S1 and S2 normal, no murmur, rub or gallop  Breast Exam:    No tenderness, masses, or nipple abnormality  Abdomen:     Soft, non-tender, bowel sounds active all four quadrants, no masses, no organomegaly.    Genitalia:    Pelvic:deferred, as patient currently on menses.   Rectal:    Normal external sphincter.  No hemorrhoids appreciated. Internal exam not done.   Extremities:   Extremities normal, atraumatic, no cyanosis or edema  Pulses:   2+ and symmetric all extremities  Skin:   Skin color, texture, turgor normal, no rashes or lesions  Lymph nodes:   Cervical, supraclavicular, and axillary nodes normal  Neurologic:   CNII-XII intact, normal strength, sensation and reflexes throughout   .  Labs:  Lab Results  Component Value Date   WBC 10.3 09/04/2016   HGB 10.5 (L) 09/04/2016   HCT 31.6 (L) 09/04/2016   MCV 81.6 09/04/2016   PLT 236.0 09/04/2016    Lab Results  Component Value Date    CREATININE 0.71 09/04/2016   BUN 9 09/04/2016   NA 139 09/04/2016   K 4.0 09/04/2016   CL 106 09/04/2016   CO2 29 09/04/2016    Lab Results  Component Value Date   ALT 8 09/04/2016   AST 11 09/04/2016   ALKPHOS 33 (L) 09/04/2016   BILITOT 0.3 09/04/2016    Lab Results  Component Value Date   TSH 0.73 09/04/2016    Lab Results  Component Value Date   CHOL 137 09/04/2016   HDL 64.70 09/04/2016   LDLCALC 64 09/04/2016   TRIG 44.0 09/04/2016   CHOLHDL 2 09/04/2016     Imaging (09/03/2016):  CLINICAL DATA:  Vaginal bleeding.  Cramping.  EXAM: OBSTETRIC <14 WK [place] AND TRANSVAGINAL OB  US  TECHNIQUE: Both transabdominal and transvaginal ultrasound examinations were performed for complete evaluation of the gestation as well as the maternal uterus, adnexal regions, and pelvic cul-de-sac. Transvaginal technique was performed to assess early pregnancy.  COMPARISON:  No prior .  FINDINGS: Intrauterine gestational sac: None visualized.  Yolk sac:  None  Embryo:  None  Cardiac Activity: None  Subchorionic hemorrhage:  None visualized.  Maternal uterus/adnexae: Endometrial thickening is noted. Endometrium measures up to 2.4 cm in diameter. A 4.1 cm fundal fibroid noted. A 3.4 cm anterior fibroid noted. A 1.6 cm submucosal fibroid noted. Ovaries appear unremarkable. No pathologic fluid collections.  IMPRESSION: 1. No intrauterine pregnancy identified.  2. Endometrial thickening is noted. Fibroid uterus.   Assessment:   Healthy female exam, establishing care.  History of miscarriage Fibroid uterus H/o pre-diabetes  Plan:    - Blood tests: None indicated.  Up to date. - Breast self exam technique reviewed and patient encouraged to perform self-exam monthly.  Contraception: none.  Patient notes that she is ambivalant about whether or not future pregnancy occurs. Declines contraception. Understands that she would be a higher risk pregnancy due to  age.  Discussed taking a daily prenatal vitamin and establishing care early if another pregnancy occurs.  - Fibroid uterus, with h/o myomectomy (patient notes ~25 fibroids were removed) in 2013.  - Patient states that she was aware of current fibroids, but not of their size. Discussed that as long as she was relatively asymptomatic, no intervention was required at this time.   Pap smear up to date.  Needs repeat in 1 year.  - Discussed healthy lifestyle modifications. Follow up in 1 year for annual exam, or sooner as needed.     Rubie Maid, MD [facility]

## 2016-10-08 NOTE — Patient Instructions (Addendum)
Health Maintenance, Female Adopting a healthy lifestyle and getting preventive care can go a long way to promote health and wellness. Talk with your health care provider about what schedule of regular examinations is right for you. This is a good chance for you to check in with your provider about disease prevention and staying healthy. In between checkups, there are plenty of things you can do on your own. Experts have done a lot of research about which lifestyle changes and preventive measures are most likely to keep you healthy. Ask your health care provider for more information. Weight and diet Eat a healthy diet  Be sure to include plenty of vegetables, fruits, low-fat dairy products, and lean protein.  Do not eat a lot of foods high in solid fats, added sugars, or salt.  Get regular exercise. This is one of the most important things you can do for your health. ? Most adults should exercise for at least 150 minutes each week. The exercise should increase your heart rate and make you sweat (moderate-intensity exercise). ? Most adults should also do strengthening exercises at least twice a week. This is in addition to the moderate-intensity exercise.  Maintain a healthy weight  Body mass index (BMI) is a measurement that can be used to identify possible weight problems. It estimates body fat based on height and weight. Your health care provider can help determine your BMI and help you achieve or maintain a healthy weight.  For females 20 years of age and older: ? A BMI below 18.5 is considered underweight. ? A BMI of 18.5 to 24.9 is normal. ? A BMI of 25 to 29.9 is considered overweight. ? A BMI of 30 and above is considered obese.  Watch levels of cholesterol and blood lipids  You should start having your blood tested for lipids and cholesterol at 41 years of age, then have this test every 5 years.  You may need to have your cholesterol levels checked more often if: ? Your lipid or  cholesterol levels are high. ? You are older than 41 years of age. ? You are at high risk for heart disease.  Cancer screening Lung Cancer  Lung cancer screening is recommended for adults 55-80 years old who are at high risk for lung cancer because of a history of smoking.  A yearly low-dose CT scan of the lungs is recommended for people who: ? Currently smoke. ? Have quit within the past 15 years. ? Have at least a 30-pack-year history of smoking. A pack year is smoking an average of one pack of cigarettes a day for 1 year.  Yearly screening should continue until it has been 15 years since you quit.  Yearly screening should stop if you develop a health problem that would prevent you from having lung cancer treatment.  Breast Cancer  Practice breast self-awareness. This means understanding how your breasts normally appear and feel.  It also means doing regular breast self-exams. Let your health care provider know about any changes, no matter how small.  If you are in your 20s or 30s, you should have a clinical breast exam (CBE) by a health care provider every 1-3 years as part of a regular health exam.  If you are 40 or older, have a CBE every year. Also consider having a breast X-ray (mammogram) every year.  If you have a family history of breast cancer, talk to your health care provider about genetic screening.  If you are at high risk   for breast cancer, talk to your health care provider about having an MRI and a mammogram every year.  Breast cancer gene (BRCA) assessment is recommended for women who have family members with BRCA-related cancers. BRCA-related cancers include: ? Breast. ? Ovarian. ? Tubal. ? Peritoneal cancers.  Results of the assessment will determine the need for genetic counseling and BRCA1 and BRCA2 testing.  Cervical Cancer Your health care provider may recommend that you be screened regularly for cancer of the pelvic organs (ovaries, uterus, and  vagina). This screening involves a pelvic examination, including checking for microscopic changes to the surface of your cervix (Pap test). You may be encouraged to have this screening done every 3 years, beginning at age 22.  For women ages 56-65, health care providers may recommend pelvic exams and Pap testing every 3 years, or they may recommend the Pap and pelvic exam, combined with testing for human papilloma virus (HPV), every 5 years. Some types of HPV increase your risk of cervical cancer. Testing for HPV may also be done on women of any age with unclear Pap test results.  Other health care providers may not recommend any screening for nonpregnant women who are considered low risk for pelvic cancer and who do not have symptoms. Ask your health care provider if a screening pelvic exam is right for you.  If you have had past treatment for cervical cancer or a condition that could lead to cancer, you need Pap tests and screening for cancer for at least 20 years after your treatment. If Pap tests have been discontinued, your risk factors (such as having a new sexual partner) need to be reassessed to determine if screening should resume. Some women have medical problems that increase the chance of getting cervical cancer. In these cases, your health care provider may recommend more frequent screening and Pap tests.  Colorectal Cancer  This type of cancer can be detected and often prevented.  Routine colorectal cancer screening usually begins at 41 years of age and continues through 41 years of age.  Your health care provider may recommend screening at an earlier age if you have risk factors for colon cancer.  Your health care provider may also recommend using home test kits to check for hidden blood in the stool.  A small camera at the end of a tube can be used to examine your colon directly (sigmoidoscopy or colonoscopy). This is done to check for the earliest forms of colorectal  cancer.  Routine screening usually begins at age 33.  Direct examination of the colon should be repeated every 5-10 years through 41 years of age. However, you may need to be screened more often if early forms of precancerous polyps or small growths are found.  Skin Cancer  Check your skin from head to toe regularly.  Tell your health care provider about any new moles or changes in moles, especially if there is a change in a mole's shape or color.  Also tell your health care provider if you have a mole that is larger than the size of a Dimitroff eraser.  Always use sunscreen. Apply sunscreen liberally and repeatedly throughout the day.  Protect yourself by wearing long sleeves, pants, a wide-brimmed hat, and sunglasses whenever you are outside.  Heart disease, diabetes, and high blood pressure  High blood pressure causes heart disease and increases the risk of stroke. High blood pressure is more likely to develop in: ? People who have blood pressure in the high end of  the normal range (130-139/85-89 mm Hg). ? People who are overweight or obese. ? People who are African American.  If you are 21-29 years of age, have your blood pressure checked every 3-5 years. If you are 3 years of age or older, have your blood pressure checked every year. You should have your blood pressure measured twice-once when you are at a hospital or clinic, and once when you are not at a hospital or clinic. Record the average of the two measurements. To check your blood pressure when you are not at a hospital or clinic, you can use: ? An automated blood pressure machine at a pharmacy. ? A home blood pressure monitor.  If you are between 17 years and 37 years old, ask your health care provider if you should take aspirin to prevent strokes.  Have regular diabetes screenings. This involves taking a blood sample to check your fasting blood sugar level. ? If you are at a normal weight and have a low risk for diabetes,  have this test once every three years after 41 years of age. ? If you are overweight and have a high risk for diabetes, consider being tested at a younger age or more often. Preventing infection Hepatitis B  If you have a higher risk for hepatitis B, you should be screened for this virus. You are considered at high risk for hepatitis B if: ? You were born in a country where hepatitis B is common. Ask your health care provider which countries are considered high risk. ? Your parents were born in a high-risk country, and you have not been immunized against hepatitis B (hepatitis B vaccine). ? You have HIV or AIDS. ? You use needles to inject street drugs. ? You live with someone who has hepatitis B. ? You have had sex with someone who has hepatitis B. ? You get hemodialysis treatment. ? You take certain medicines for conditions, including cancer, organ transplantation, and autoimmune conditions.  Hepatitis C  Blood testing is recommended for: ? Everyone born from [birth year] through 1965. ? Anyone with known risk factors for hepatitis C.  Sexually transmitted infections (STIs)  You should be screened for sexually transmitted infections (STIs) including gonorrhea and chlamydia if: ? You are sexually active and are younger than 41 years of age. ? You are older than 41 years of age and your health care provider tells you that you are at risk for this type of infection. ? Your sexual activity has changed since you were last screened and you are at an increased risk for chlamydia or gonorrhea. Ask your health care provider if you are at risk.  If you do not have HIV, but are at risk, it may be recommended that you take a prescription medicine daily to prevent HIV infection. This is called pre-exposure prophylaxis (PrEP). You are considered at risk if: ? You are sexually active and do not regularly use condoms or know the HIV status of your partner(s). ? You take drugs by injection. ? You are  sexually active with a partner who has HIV.  Talk with your health care provider about whether you are at high risk of being infected with HIV. If you choose to begin PrEP, you should first be tested for HIV. You should then be tested every 3 months for as long as you are taking PrEP. Pregnancy  If you are premenopausal and you may become pregnant, ask your health care provider about preconception counseling.  If you may become  pregnant, take 400 to 800 micrograms (mcg) of folic acid every day.  If you want to prevent pregnancy, talk to your health care provider about birth control (contraception). Osteoporosis and menopause  Osteoporosis is a disease in which the bones lose minerals and strength with aging. This can result in serious bone fractures. Your risk for osteoporosis can be identified using a bone density scan.  If you are 65 years of age or older, or if you are at risk for osteoporosis and fractures, ask your health care provider if you should be screened.  Ask your health care provider whether you should take a calcium or vitamin D supplement to lower your risk for osteoporosis.  Menopause may have certain physical symptoms and risks.  Hormone replacement therapy may reduce some of these symptoms and risks. Talk to your health care provider about whether hormone replacement therapy is right for you. Follow these instructions at home:  Schedule regular health, dental, and eye exams.  Stay current with your immunizations.  Do not use any tobacco products including cigarettes, chewing tobacco, or electronic cigarettes.  If you are pregnant, do not drink alcohol.  If you are breastfeeding, limit how much and how often you drink alcohol.  Limit alcohol intake to no more than 1 drink per day for nonpregnant women. One drink equals 12 ounces of beer, 5 ounces of wine, or 1 ounces of hard liquor.  Do not use street drugs.  Do not share needles.  Ask your health care  provider for help if you need support or information about quitting drugs.  Tell your health care provider if you often feel depressed.  Tell your health care provider if you have ever been abused or do not feel safe at home. This information is not intended to replace advice given to you by your health care provider. Make sure you discuss any questions you have with your health care provider. Document Released: 10/22/2010 Document Revised: 09/14/2015 Document Reviewed: 01/10/2015 Elsevier Interactive Patient Education  2018 [employer]       Preparing for Pregnancy If you are considering becoming pregnant, make an appointment to see your regular health care provider to learn how to prepare for a safe and healthy pregnancy (preconception care). During a preconception care visit, your health care provider will:  Do a complete physical exam, including a Pap test.  Take a complete medical history.  Give you information, answer your questions, and help you resolve problems.  Preconception checklist Medical history  Tell your health care provider about any current or past medical conditions. Your pregnancy or your ability to become pregnant may be affected by chronic conditions, such as diabetes, chronic hypertension, and thyroid problems.  Include your family's medical history as well as your partner's medical history.  Tell your health care provider about any history of STIs (sexually transmitted infections).These can affect your pregnancy. In some cases, they can be passed to your baby. Discuss any concerns that you have about STIs.  If indicated, discuss the benefits of genetic testing. This testing will show whether there are any genetic conditions that may be passed from you or your partner to your baby.  Tell your health care provider about: ? Any problems you have had with conception or pregnancy. ? Any medicines you take. These include vitamins, herbal supplements, and  over-the-counter medicines. ? Your history of immunizations. Discuss any vaccinations that you may need.  Diet  Ask your health care provider what to include in a healthy   diet that has a balance of nutrients. This is especially important when you are pregnant or preparing to become pregnant.  Ask your health care provider to help you reach a healthy weight before pregnancy. ? If you are overweight, you may be at higher risk for certain complications, such as high blood pressure, diabetes, and preterm birth. ? If you are underweight, you are more likely to have a baby who has a low birth weight.  Lifestyle, work, and home  Let your health care provider know: ? About any lifestyle habits that you have, such as alcohol use, drug use, or smoking. ? About recreational activities that may put you at risk during pregnancy, such as downhill skiing and certain exercise programs. ? Tell your health care provider about any international travel, especially any travel to places with an active Zika virus outbreak. ? About harmful substances that you may be exposed to at work or at home. These include chemicals, pesticides, radiation, or even litter boxes. ? If you do not feel safe at home.  Mental health  Tell your health care provider about: ? Any history of mental health conditions, including feelings of depression, sadness, or anxiety. ? Any medicines that you take for a mental health condition. These include herbs and supplements.  Home instructions to prepare for pregnancy Lifestyle  Eat a balanced diet. This includes fresh fruits and vegetables, whole grains, lean meats, low-fat dairy products, healthy fats, and foods that are high in fiber. Ask to meet with a nutritionist or registered dietitian for assistance with meal planning and goals.  Get regular exercise. Try to be active for at least 30 minutes a day on most days of the week. Ask your health care provider which activities are safe  during pregnancy.  Do not use any products that contain nicotine or tobacco, such as cigarettes and e-cigarettes. If you need help quitting, ask your health care provider.  Do not drink alcohol.  Do not take illegal drugs.  Maintain a healthy weight. Ask your health care provider what weight range is right for you.  General instructions  Keep an accurate record of your menstrual periods. This makes it easier for your health care provider to determine your baby's due date.  Begin taking prenatal vitamins and folic acid supplements daily as directed by your health care provider.  Manage any chronic conditions, such as high blood pressure and diabetes, as told by your health care provider. This is important.  How do I know that I am pregnant? You may be pregnant if you have been sexually active and you miss your period. Symptoms of early pregnancy include:  Mild cramping.  Very light vaginal bleeding (spotting).  Feeling unusually tired.  Nausea and vomiting (morning sickness).  If you have any of these symptoms and you suspect that you might be pregnant, you can take a home pregnancy test. These tests check for a hormone in your urine (human chorionic gonadotropin, or hCG). A woman's body begins to make this hormone during early pregnancy. These tests are very accurate. Wait until at least the first day after you miss your period to take one. If the test shows that you are pregnant (you get a positive result), call your health care provider to make an appointment for prenatal care. What should I do if I become pregnant?  Make an appointment with your health care provider as soon as you suspect you are pregnant.  Do not use any products that contain nicotine, such as   If you need help quitting, ask your health care provider.  Do not drink alcoholic beverages. Alcohol is related to a number of birth defects.  Avoid toxic odors and  chemicals.  You may continue to have sexual intercourse if it does not cause pain or other problems, such as vaginal bleeding. This information is not intended to replace advice given to you by your health care provider. Make sure you discuss any questions you have with your health care provider. Document Released: 03/21/2008 Document Revised: 12/05/2015 Document Reviewed: 10/29/2015 Elsevier Interactive Patient Education  2017 [employer].

## 2017-05-13 ENCOUNTER — Ambulatory Visit (INDEPENDENT_AMBULATORY_CARE_PROVIDER_SITE_OTHER): Payer: 59 | Admitting: Obstetrics and Gynecology

## 2017-05-13 ENCOUNTER — Encounter: Payer: Self-pay | Admitting: Obstetrics and Gynecology

## 2017-05-13 ENCOUNTER — Ambulatory Visit (INDEPENDENT_AMBULATORY_CARE_PROVIDER_SITE_OTHER): Payer: 59

## 2017-05-13 VITALS — BP 123/74 | HR 81 | Ht 66.0 in | Wt 174.2 lb

## 2017-05-13 DIAGNOSIS — Z1231 Encounter for screening mammogram for malignant neoplasm of breast: Secondary | ICD-10-CM | POA: Diagnosis not present

## 2017-05-13 DIAGNOSIS — Z01419 Encounter for gynecological examination (general) (routine) without abnormal findings: Secondary | ICD-10-CM | POA: Diagnosis not present

## 2017-05-13 DIAGNOSIS — N920 Excessive and frequent menstruation with regular cycle: Secondary | ICD-10-CM | POA: Diagnosis not present

## 2017-05-13 DIAGNOSIS — Z124 Encounter for screening for malignant neoplasm of cervix: Secondary | ICD-10-CM

## 2017-05-13 DIAGNOSIS — R350 Frequency of micturition: Secondary | ICD-10-CM | POA: Diagnosis not present

## 2017-05-13 LAB — POCT URINALYSIS DIPSTICK
BILIRUBIN UA: NEGATIVE
Blood, UA: NEGATIVE
GLUCOSE UA: NEGATIVE
KETONES UA: NEGATIVE
Leukocytes, UA: NEGATIVE
Nitrite, UA: NEGATIVE
PROTEIN UA: NEGATIVE
Spec Grav, UA: 1.015 (ref 1.010–1.025)
Urobilinogen, UA: 0.2 E.U./dL
pH, UA: 6.5 (ref 5.0–8.0)

## 2017-05-13 NOTE — Progress Notes (Addendum)
GYNECOLOGY ANNUAL PHYSICAL EXAM PROGRESS NOTE  Subjective:    Tracy Ingram is a 42 y.o. [id number] female with h/o fibroid uterus who presents for a routine gynecologic examination.  The patient is sexually active.  The patient wears seatbelts: yes. The patient participates in regular exercise: yes (3-4 x weekly). Has the patient ever been transfused or tattooed?: no. The patient reports that there is not domestic violence in her life.   The patient notes the following today: 1.  Patient notes that periods have become heavier. Has always had heavy periods but notes that gradually they have become even heavier. Periods normally last 7 days. Is using super pads for the first -3-4 days changing q 2-3 hours, then gets lighter. Denies dysmenorrhea.  2. Urinary frequency. Denies hematuria or dysuria  Gynecologic History  Menarche age: 36 Patient's last menstrual period was 04/16/2017. Contraception: none History of STI's: Denies Last Pap: 3 years ago. Results were: normal.  Notes h/o abnormal pap smears. Last mammogram: 04/2016. Results were: normal   Obstetric History   G1   P0   T0   P0   A1   L0    SAB1   TAB0   Ectopic0   Multiple0   Live Births0     # Outcome Date GA Lbr Len/2nd Weight Sex Delivery Anes PTL Lv  1 SAB 08/2016 [redacted]w[redacted]d    SAB         Past Medical History:  Diagnosis Date  . Fibroid   . History of miscarriage   . Seasonal allergies     Past Surgical History:  Procedure Laterality Date  . EYE SURGERY  2015   had nystagmus per patient  . MYOMECTOMY  2013    Family History  Problem Relation Age of Onset  . Fibroids Maternal Aunt   . Thyroid disease Mother   . Breast cancer Neg Hx   . Colon cancer Neg Hx   . Ovarian cancer Neg Hx     Social History   Socioeconomic History  . Marital status: Married    Spouse name: Not on file  . Number of children: Not on file  . Years of education: Not on file  . Highest education level: Not on file  Social  Needs  . Financial resource strain: Not on file  . Food insecurity - worry: Not on file  . Food insecurity - inability: Not on file  . Transportation needs - medical: Not on file  . Transportation needs - non-medical: Not on file  Occupational History  . Not on file  Tobacco Use  . Smoking status: Never Smoker  . Smokeless tobacco: Never Used  Substance and Sexual Activity  . Alcohol use: Yes    Comment: rare  . Drug use: No  . Sexual activity: Yes    Birth control/protection: None  Other Topics Concern  . Not on file  Social History Narrative   From [place].       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 Multiple Vitamin (MULTIVITAMIN) tablet Take 1 tablet by mouth daily.    . Dapsone (ACZONE) 5 % topical gel Apply topically 2 (two) times daily.    . tazarotene (TAZORAC) 0.05 % cream Apply topically at bedtime.  No current facility-administered medications on file prior to visit.     Allergies  Allergen Reactions  . Sulfa Antibiotics     Review of Systems Constitutional: negative for chills, fatigue, fevers and sweats Eyes: negative for irritation, redness and visual disturbance Ears, nose, mouth, throat, and face: negative for hearing loss, nasal congestion, snoring and tinnitus Respiratory: negative for asthma, cough, sputum Cardiovascular: negative for chest pain, dyspnea, exertional chest pressure/discomfort, irregular heart beat, palpitations and syncope Gastrointestinal: negative for abdominal pain, change in bowel habits, nausea and vomiting Genitourinary:  Positive for abnormal menstrual periods (see HPI), pelvic pressure, urinary frequency, and decreased libido. Negative for  genital lesions, sexual problems and vaginal discharge, dysuria and urinary incontinence Integument/breast: negative for breast lump, breast  tenderness and nipple discharge Hematologic/lymphatic: negative for bleeding and easy bruising Musculoskeletal:negative for back pain and muscle weakness Neurological: negative for dizziness, headaches, vertigo and weakness Endocrine: negative for diabetic symptoms including polydipsia, polyuria and skin dryness Allergic/Immunologic: negative for hay fever and urticaria       Objective:  Blood pressure 123/74, pulse 81, height 5\' 6"  (1.676 m), weight 174 lb 3.2 oz (79 kg), last menstrual period 04/16/2017. Body mass index is 28.12 kg/m.   General Appearance:    Alert, cooperative, no distress, appears stated age  Head:    Normocephalic, without obvious abnormality, atraumatic  Eyes:    PERRL, conjunctiva/corneas clear, EOM's intact, both eyes  Ears:    Normal external ear canals, both ears  Nose:   Nares normal, septum midline, mucosa normal, no drainage or sinus tenderness  Throat:   Lips, mucosa, and tongue normal; teeth and gums normal  Neck:   Supple, symmetrical, trachea midline, no adenopathy; thyroid: no enlargement/tenderness/nodules; no carotid bruit or JVD  Back:     Symmetric, no curvature, ROM normal, no CVA tenderness  Lungs:     Clear to auscultation bilaterally, respirations unlabored  Chest Wall:    No tenderness or deformity   Heart:    Regular rate and rhythm, S1 and S2 normal, no murmur, rub or gallop  Breast Exam:    No tenderness, masses, or nipple abnormality  Abdomen:     Soft, non-tender, bowel sounds active all four quadrants, no masses, no organomegaly.    Genitalia:    External genitalia normal, rectovaginal septum normal.  Vagina with moderate amount of thin white discharge, no odor.  Cervix normal appearing, no lesions and no motion tenderness.     Uterus slightly enlarged with irregular contours, 12-14 week size.  Mobile, nontender.  Adnexae non-palpable, nontender bilaterally.   Rectal:    Normal external sphincter.  No hemorrhoids appreciated. Internal exam  not done.   Extremities:   Extremities normal, atraumatic, no cyanosis or edema  Pulses:   2+ and symmetric all extremities  Skin:   Skin color, texture, turgor normal, no rashes or lesions  Lymph nodes:   Cervical, supraclavicular, and axillary nodes normal  Neurologic:   CNII-XII intact, normal strength, sensation and reflexes throughout   .  Labs:  Lab Results  Component Value Date   WBC 10.3 09/04/2016   HGB 10.5 (L) 09/04/2016   HCT 31.6 (L) 09/04/2016   MCV 81.6 09/04/2016   PLT 236.0 09/04/2016    Lab Results  Component Value Date   CREATININE 0.71 09/04/2016   BUN 9 09/04/2016   NA 139 09/04/2016   K 4.0 09/04/2016   CL 106 09/04/2016   CO2 29 09/04/2016    Lab Results  Component Value Date   ALT 8 09/04/2016   AST 11 09/04/2016   ALKPHOS 33 (L) 09/04/2016   BILITOT 0.3 09/04/2016    Lab Results  Component Value Date   TSH 0.73 09/04/2016    Lab Results  Component Value Date   CHOL 137 09/04/2016   HDL 64.70 09/04/2016   LDLCALC 64 09/04/2016   TRIG 44.0 09/04/2016   CHOLHDL 2 09/04/2016     Imaging (09/03/2016):  CLINICAL DATA:  Vaginal bleeding.  Cramping.  EXAM: OBSTETRIC <14 WK [place] AND TRANSVAGINAL OB US  TECHNIQUE: Both transabdominal and transvaginal ultrasound examinations were performed for complete evaluation of the gestation as well as the maternal uterus, adnexal regions, and pelvic cul-de-sac. Transvaginal technique was performed to assess early pregnancy.  COMPARISON:  No prior .  FINDINGS: Intrauterine gestational sac: None visualized.  Yolk sac:  None  Embryo:  None  Cardiac Activity: None  Subchorionic hemorrhage:  None visualized.  Maternal uterus/adnexae: Endometrial thickening is noted. Endometrium measures up to 2.4 cm in diameter. A 4.1 cm fundal fibroid noted. A 3.4 cm anterior fibroid noted. A 1.6 cm submucosal fibroid noted. Ovaries appear unremarkable. No pathologic  fluid collections.  IMPRESSION: 1. No intrauterine pregnancy identified.  2. Endometrial thickening is noted. Fibroid uterus.    Results for orders placed or performed in visit on 05/13/17  POCT urinalysis dipstick  Result Value Ref Range   Color, UA yellow    Clarity, UA clear    Glucose, UA neg    Bilirubin, UA neg    Ketones, UA neg    Spec Grav, UA 1.015 1.010 - 1.025   Blood, UA neg    pH, UA 6.5 5.0 - 8.0   Protein, UA neg    Urobilinogen, UA 0.2 0.2 or 1.0 E.U./dL   Nitrite, UA neg    Leukocytes, UA Negative Negative   Appearance     Odor       Assessment:   Routine gynecologic exam.  Fibroid uterus Menorrhagia Decreased libido Dysuria  Plan:    - Blood tests: FSH/LH, Estradiol, Testosterone, Progesterone.  Will have annual labs performed by PCP in 1 month.  - Breast self exam technique reviewed and patient encouraged to perform self-exam monthly.  Contraception: none.  Patient is ambivalent regarding pregnancy. Discussed hormonal regimen may be indicated for other symptoms such as menorrhagia and fibroids. Patient notes understanding.  - Pap smear performed today.  - Discussed healthy lifestyle modifications. - Menorrhagia worsening. Briefly discussed management options for abnormal uterine bleeding including tranexamic acid (Lysteda), oral progesterone (Megace), Depo Provera, Mirena IUD, endometrial ablation (Novasure/Hydrothermal Ablation) or hysterectomy as definitive surgical management.  Discussed risks and benefits of each method. Will discuss further at next visit after ultrasound performed.  - Fibroid uterus, with h/o myomectomy (patient notes ~25 fibroids were removed) in 2013.  Last ultrasound in 2018 notes largest fibroid 4.1 cm.  Patient now with change in menses, heavier cycles. Uterine enlargement noted on today's exam. Will repeat to assess if change in size or number of fibroids.  - Decreased libido may be due to change in hormonal status or  hormonal imbalance. May be helped with hormonal supplementation. To discuss further next visit.  - Urinary frequency, with normal UA today. May be secondary to pressure symptoms from enlarged uterus.  Follow up in 1 year for annual exam, or sooner as needed.  To f/u in 2-3 weeks after ultrasound to discuss further management.    Rubie Maid, MD [facility]      Rubie Maid, MD [facility]

## 2017-05-13 NOTE — Patient Instructions (Addendum)
Health Maintenance, Female Adopting a healthy lifestyle and getting preventive care can go a long way to promote health and wellness. Talk with your health care provider about what schedule of regular examinations is right for you. This is a good chance for you to check in with your provider about disease prevention and staying healthy. In between checkups, there are plenty of things you can do on your own. Experts have done a lot of research about which lifestyle changes and preventive measures are most likely to keep you healthy. Ask your health care provider for more information. Weight and diet Eat a healthy diet  Be sure to include plenty of vegetables, fruits, low-fat dairy products, and lean protein.  Do not eat a lot of foods high in solid fats, added sugars, or salt.  Get regular exercise. This is one of the most important things you can do for your health. ? Most adults should exercise for at least 150 minutes each week. The exercise should increase your heart rate and make you sweat (moderate-intensity exercise). ? Most adults should also do strengthening exercises at least twice a week. This is in addition to the moderate-intensity exercise.  Maintain a healthy weight  Body mass index (BMI) is a measurement that can be used to identify possible weight problems. It estimates body fat based on height and weight. Your health care provider can help determine your BMI and help you achieve or maintain a healthy weight.  For females 20 years of age and older: ? A BMI below 18.5 is considered underweight. ? A BMI of 18.5 to 24.9 is normal. ? A BMI of 25 to 29.9 is considered overweight. ? A BMI of 30 and above is considered obese.  Watch levels of cholesterol and blood lipids  You should start having your blood tested for lipids and cholesterol at 42 years of age, then have this test every 5 years.  You may need to have your cholesterol levels checked more often if: ? Your lipid or  cholesterol levels are high. ? You are older than 42 years of age. ? You are at high risk for heart disease.  Cancer screening Lung Cancer  Lung cancer screening is recommended for adults 55-80 years old who are at high risk for lung cancer because of a history of smoking.  A yearly low-dose CT scan of the lungs is recommended for people who: ? Currently smoke. ? Have quit within the past 15 years. ? Have at least a 30-pack-year history of smoking. A pack year is smoking an average of one pack of cigarettes a day for 1 year.  Yearly screening should continue until it has been 15 years since you quit.  Yearly screening should stop if you develop a health problem that would prevent you from having lung cancer treatment.  Breast Cancer  Practice breast self-awareness. This means understanding how your breasts normally appear and feel.  It also means doing regular breast self-exams. Let your health care provider know about any changes, no matter how small.  If you are in your 20s or 30s, you should have a clinical breast exam (CBE) by a health care provider every 1-3 years as part of a regular health exam.  If you are 40 or older, have a CBE every year. Also consider having a breast X-ray (mammogram) every year.  If you have a family history of breast cancer, talk to your health care provider about genetic screening.  If you are at high risk   for breast cancer, talk to your health care provider about having an MRI and a mammogram every year.  Breast cancer gene (BRCA) assessment is recommended for women who have family members with BRCA-related cancers. BRCA-related cancers include: ? Breast. ? Ovarian. ? Tubal. ? Peritoneal cancers.  Results of the assessment will determine the need for genetic counseling and BRCA1 and BRCA2 testing.  Cervical Cancer Your health care provider may recommend that you be screened regularly for cancer of the pelvic organs (ovaries, uterus, and  vagina). This screening involves a pelvic examination, including checking for microscopic changes to the surface of your cervix (Pap test). You may be encouraged to have this screening done every 3 years, beginning at age 22.  For women ages 56-65, health care providers may recommend pelvic exams and Pap testing every 3 years, or they may recommend the Pap and pelvic exam, combined with testing for human papilloma virus (HPV), every 5 years. Some types of HPV increase your risk of cervical cancer. Testing for HPV may also be done on women of any age with unclear Pap test results.  Other health care providers may not recommend any screening for nonpregnant women who are considered low risk for pelvic cancer and who do not have symptoms. Ask your health care provider if a screening pelvic exam is right for you.  If you have had past treatment for cervical cancer or a condition that could lead to cancer, you need Pap tests and screening for cancer for at least 20 years after your treatment. If Pap tests have been discontinued, your risk factors (such as having a new sexual partner) need to be reassessed to determine if screening should resume. Some women have medical problems that increase the chance of getting cervical cancer. In these cases, your health care provider may recommend more frequent screening and Pap tests.  Colorectal Cancer  This type of cancer can be detected and often prevented.  Routine colorectal cancer screening usually begins at 42 years of age and continues through 42 years of age.  Your health care provider may recommend screening at an earlier age if you have risk factors for colon cancer.  Your health care provider may also recommend using home test kits to check for hidden blood in the stool.  A small camera at the end of a tube can be used to examine your colon directly (sigmoidoscopy or colonoscopy). This is done to check for the earliest forms of colorectal  cancer.  Routine screening usually begins at age 33.  Direct examination of the colon should be repeated every 5-10 years through 43 years of age. However, you may need to be screened more often if early forms of precancerous polyps or small growths are found.  Skin Cancer  Check your skin from head to toe regularly.  Tell your health care provider about any new moles or changes in moles, especially if there is a change in a mole's shape or color.  Also tell your health care provider if you have a mole that is larger than the size of a [place] eraser.  Always use sunscreen. Apply sunscreen liberally and repeatedly throughout the day.  Protect yourself by wearing long sleeves, pants, a wide-brimmed hat, and sunglasses whenever you are outside.  Heart disease, diabetes, and high blood pressure  High blood pressure causes heart disease and increases the risk of stroke. High blood pressure is more likely to develop in: ? People who have blood pressure in the high end of  the normal range (130-139/85-89 mm Hg). ? People who are overweight or obese. ? People who are African American.  If you are 21-29 years of age, have your blood pressure checked every 3-5 years. If you are 3 years of age or older, have your blood pressure checked every year. You should have your blood pressure measured twice-once when you are at a hospital or clinic, and once when you are not at a hospital or clinic. Record the average of the two measurements. To check your blood pressure when you are not at a hospital or clinic, you can use: ? An automated blood pressure machine at a pharmacy. ? A home blood pressure monitor.  If you are between 17 years and 37 years old, ask your health care provider if you should take aspirin to prevent strokes.  Have regular diabetes screenings. This involves taking a blood sample to check your fasting blood sugar level. ? If you are at a normal weight and have a low risk for diabetes,  have this test once every three years after 42 years of age. ? If you are overweight and have a high risk for diabetes, consider being tested at a younger age or more often. Preventing infection Hepatitis B  If you have a higher risk for hepatitis B, you should be screened for this virus. You are considered at high risk for hepatitis B if: ? You were born in a country where hepatitis B is common. Ask your health care provider which countries are considered high risk. ? Your parents were born in a high-risk country, and you have not been immunized against hepatitis B (hepatitis B vaccine). ? You have HIV or AIDS. ? You use needles to inject street drugs. ? You live with someone who has hepatitis B. ? You have had sex with someone who has hepatitis B. ? You get hemodialysis treatment. ? You take certain medicines for conditions, including cancer, organ transplantation, and autoimmune conditions.  Hepatitis C  Blood testing is recommended for: ? Everyone born from [birth year] through 1965. ? Anyone with known risk factors for hepatitis C.  Sexually transmitted infections (STIs)  You should be screened for sexually transmitted infections (STIs) including gonorrhea and chlamydia if: ? You are sexually active and are younger than 42 years of age. ? You are older than 42 years of age and your health care provider tells you that you are at risk for this type of infection. ? Your sexual activity has changed since you were last screened and you are at an increased risk for chlamydia or gonorrhea. Ask your health care provider if you are at risk.  If you do not have HIV, but are at risk, it may be recommended that you take a prescription medicine daily to prevent HIV infection. This is called pre-exposure prophylaxis (PrEP). You are considered at risk if: ? You are sexually active and do not regularly use condoms or know the HIV status of your partner(s). ? You take drugs by injection. ? You are  sexually active with a partner who has HIV.  Talk with your health care provider about whether you are at high risk of being infected with HIV. If you choose to begin PrEP, you should first be tested for HIV. You should then be tested every 3 months for as long as you are taking PrEP. Pregnancy  If you are premenopausal and you may become pregnant, ask your health care provider about preconception counseling.  If you may become  pregnant, take 400 to 800 micrograms (mcg) of folic acid every day.  If you want to prevent pregnancy, talk to your health care provider about birth control (contraception). Osteoporosis and menopause  Osteoporosis is a disease in which the bones lose minerals and strength with aging. This can result in serious bone fractures. Your risk for osteoporosis can be identified using a bone density scan.  If you are 65 years of age or older, or if you are at risk for osteoporosis and fractures, ask your health care provider if you should be screened.  Ask your health care provider whether you should take a calcium or vitamin D supplement to lower your risk for osteoporosis.  Menopause may have certain physical symptoms and risks.  Hormone replacement therapy may reduce some of these symptoms and risks. Talk to your health care provider about whether hormone replacement therapy is right for you. Follow these instructions at home:  Schedule regular health, dental, and eye exams.  Stay current with your immunizations.  Do not use any tobacco products including cigarettes, chewing tobacco, or electronic cigarettes.  If you are pregnant, do not drink alcohol.  If you are breastfeeding, limit how much and how often you drink alcohol.  Limit alcohol intake to no more than 1 drink per day for nonpregnant women. One drink equals 12 ounces of beer, 5 ounces of wine, or 1 ounces of hard liquor.  Do not use street drugs.  Do not share needles.  Ask your health care  provider for help if you need support or information about quitting drugs.  Tell your health care provider if you often feel depressed.  Tell your health care provider if you have ever been abused or do not feel safe at home. This information is not intended to replace advice given to you by your health care provider. Make sure you discuss any questions you have with your health care provider. Document Released: 10/22/2010 Document Revised: 09/14/2015 Document Reviewed: 01/10/2015 [employer] Interactive Patient Education  2018 [employer]    Menorrhagia Menorrhagia is a condition in which menstrual periods are heavy or last longer than normal. With menorrhagia, most periods a woman has may cause enough blood loss and cramping that she becomes unable to take part in her usual activities. What are the causes? Common causes of this condition include:  Noncancerous growths in the uterus (polyps or fibroids).  An imbalance of the estrogen and progesterone hormones.  One of the ovaries not releasing an egg during one or more months.  A problem with the thyroid gland (hypothyroid).  Side effects of having an intrauterine device (IUD).  Side effects of some medicines, such as anti-inflammatory medicines or blood thinners.  A bleeding disorder that stops the blood from clotting normally.  In some cases, the cause of this condition is not known. What are the signs or symptoms? Symptoms of this condition include:  Routinely having to change your pad or tampon every 1-2 hours because it is completely soaked.  Needing to use pads and tampons at the same time because of heavy bleeding.  Needing to wake up to change your pads or tampons during the night.  Passing blood clots larger than 1 inch (2.5 cm) in size.  Having bleeding that lasts for more than 7 days.  Having symptoms of low iron levels (anemia), such as tiredness, fatigue, or shortness of breath.  How is this  diagnosed? This condition may be diagnosed based on:  A physical exam.  Your   symptoms and menstrual history.  Tests, such as: ? Blood tests to check if you are pregnant or have hormonal changes, a bleeding or thyroid disorder, anemia, or other problems. ? Pap test to check for cancerous changes, infections, or inflammation. ? Endometrial biopsy. This test involves removing a tissue sample from the lining of the uterus (endometrium) to be examined under a microscope. ? Pelvic ultrasound. This test uses sound waves to create images of your uterus, ovaries, and vagina. The images can show if you have fibroids or other growths. ? Hysteroscopy. For this test, a small telescope is used to look inside your uterus.  How is this treated? Treatment may not be needed for this condition. If it is needed, the best treatment for you will depend on:  Whether you need to prevent pregnancy.  Your desire to have children in the future.  The cause and severity of your bleeding.  Your personal preference.  Medicines are the first step in treatment. You may be treated with:  Hormonal birth control methods. These treatments reduce bleeding during your menstrual period. They include: ? Birth control pills. ? Skin patch. ? Vaginal ring. ? Shots (injections) that you get every 3 months. ? Hormonal IUD (intrauterine device). ? Implants that go under the skin.  Medicines that thicken blood and slow bleeding.  Medicines that reduce swelling, such as ibuprofen.  Medicines that contain an artificial (synthetic) hormone called progestin.  Medicines that make the ovaries stop working for a short time.  Iron supplements to treat anemia.  If medicines do not work, surgery may be done. Surgical options may include:  Dilation and curettage (D&C). In this procedure, your health care provider opens (dilates) your cervix and then scrapes or suctions tissue from the endometrium to reduce menstrual  bleeding.  Operative hysteroscopy. In this procedure, a small tube with a light on the end (hysteroscope) is used to view your uterus and help remove polyps that may be causing heavy periods.  Endometrial ablation. This is when various techniques are used to permanently destroy your entire endometrium. After endometrial ablation, most women have little or no menstrual flow. This procedure reduces your ability to become pregnant.  Endometrial resection. In this procedure, an electrosurgical wire loop is used to remove the endometrium. This procedure reduces your ability to become pregnant.  Hysterectomy. This is surgical removal of the uterus. This is a permanent procedure that stops menstrual periods. Pregnancy is not possible after a hysterectomy.  Follow these instructions at home: Medicines  Take over-the-counter and prescription medicines exactly as told by your health care provider. This includes iron pills.  Do not change or switch medicines without asking your health care provider.  Do not take aspirin or medicines that contain aspirin 1 week before or during your menstrual period. Aspirin may make bleeding worse. General instructions  If you need to change your sanitary pad or tampon more than once every 2 hours, limit your activity until the bleeding stops.  Iron pills can cause constipation. To prevent or treat constipation while you are taking prescription iron supplements, your health care provider may recommend that you: ? Drink enough fluid to keep your urine clear or pale yellow. ? Take over-the-counter or prescription medicines. ? Eat foods that are high in fiber, such as fresh fruits and vegetables, whole grains, and beans. ? Limit foods that are high in fat and processed sugars, such as fried and sweet foods.  Eat well-balanced meals, including foods that are high in iron.   Foods that have a lot of iron include leafy green vegetables, meat, liver, eggs, and whole grain  breads and cereals.  Do not try to lose weight until the abnormal bleeding has stopped and your blood iron level is back to normal. If you need to lose weight, work with your health care provider to lose weight safely.  Keep all follow-up visits as told by your health care provider. This is important. Contact a health care provider if:  You soak through a pad or tampon every 1 or 2 hours, and this happens every time you have a period.  You need to use pads and tampons at the same time because you are bleeding so much.  You have nausea, vomiting, diarrhea, or other problems related to medicines you are taking. Get help right away if:  You soak through more than a pad or tampon in 1 hour.  You pass clots bigger than 1 inch (2.5 cm) wide.  You feel short of breath.  You feel like your heart is beating too fast.  You feel dizzy or faint.  You feel very weak or tired. Summary  Menorrhagia is a condition in which menstrual periods are heavy or last longer than normal.  Treatment will depend on the cause of the condition and may include medicines or procedures.  Take over-the-counter and prescription medicines exactly as told by your health care provider. This includes iron pills.  Get help right away if you have heavy bleeding that soaks through more than a pad or tampon in 1 hour, you are passing large clots, or you feel dizzy, faint or short of breath. This information is not intended to replace advice given to you by your health care provider. Make sure you discuss any questions you have with your health care provider. Document Released: 04/08/2005 Document Revised: 04/01/2016 Document Reviewed: 04/01/2016 [employer] Interactive Patient Education  2018 [employer]  

## 2017-05-15 LAB — FSH/LH
FSH: 2.9 m[IU]/mL
LH: 4.8 m[IU]/mL

## 2017-05-15 LAB — TESTOSTERONE, FREE, TOTAL, SHBG
Sex Hormone Binding: 178.5 nmol/L — ABNORMAL HIGH (ref 24.6–122.0)
Testosterone, Free: 0.7 pg/mL (ref 0.0–4.2)
Testosterone: 18 ng/dL (ref 8–48)

## 2017-05-15 LAB — PROGESTERONE: Progesterone: 4.1 ng/mL

## 2017-05-15 LAB — ESTRADIOL: ESTRADIOL: 173 pg/mL

## 2017-05-16 LAB — IGP, COBASHPV16/18
HPV 16: NEGATIVE
HPV 18: NEGATIVE
HPV other hr types: NEGATIVE
PAP Smear Comment: 0

## 2017-05-30 ENCOUNTER — Encounter: Payer: Self-pay | Admitting: Obstetrics and Gynecology

## 2017-05-30 ENCOUNTER — Ambulatory Visit (INDEPENDENT_AMBULATORY_CARE_PROVIDER_SITE_OTHER): Payer: 59 | Admitting: Obstetrics and Gynecology

## 2017-05-30 VITALS — BP 124/74 | HR 78 | Wt 171.8 lb

## 2017-05-30 DIAGNOSIS — R6882 Decreased libido: Secondary | ICD-10-CM | POA: Diagnosis not present

## 2017-05-30 DIAGNOSIS — D252 Subserosal leiomyoma of uterus: Secondary | ICD-10-CM | POA: Diagnosis not present

## 2017-05-30 DIAGNOSIS — N920 Excessive and frequent menstruation with regular cycle: Secondary | ICD-10-CM

## 2017-05-30 DIAGNOSIS — D251 Intramural leiomyoma of uterus: Secondary | ICD-10-CM

## 2017-05-30 NOTE — Progress Notes (Signed)
GYNECOLOGY PROGRESS NOTE  Subjective:    Patient ID: Tracy Ingram, female    DOB: Nov 06, 1975, 42 y.o.   MRN: [id number]  HPI  Patient is a 42 y.o. G92P0010 female who presents for discussion of ultrasound and lab results.  Patient has a h/o fibroid uterus, also with menorrhagia with regular cycle.  Also noting complaints of decreased libido.   The following portions of the patient's history were reviewed and updated as appropriate: allergies, current medications, past family history, past medical history, past social history, past surgical history and problem list.  Review of Systems Pertinent items noted in HPI and remainder of comprehensive ROS otherwise negative.   Objective:   Blood pressure 124/74, pulse 78, weight 171 lb 12.8 oz (77.9 kg), last menstrual period 05/16/2017. General appearance: alert and no distress Remainder of exam deferred.    Labs:  Office Visit on 05/13/2017  Component Date Value Ref Range Status  . Color, UA 05/13/2017 yellow   Final  . Clarity, UA 05/13/2017 clear   Final  . Glucose, UA 05/13/2017 neg   Final  . Bilirubin, UA 05/13/2017 neg   Final  . Ketones, UA 05/13/2017 neg   Final  . Spec Grav, UA 05/13/2017 1.015  1.010 - 1.025 Final  . Blood, UA 05/13/2017 neg   Final  . pH, UA 05/13/2017 6.5  5.0 - 8.0 Final  . Protein, UA 05/13/2017 neg   Final  . Urobilinogen, UA 05/13/2017 0.2  0.2 or 1.0 E.U./dL Final  . Nitrite, UA 05/13/2017 neg   Final  . Leukocytes, UA 05/13/2017 Negative  Negative Final  . DIAGNOSIS: 05/13/2017 Comment   Final   NEGATIVE FOR INTRAEPITHELIAL LESION OR MALIGNANCY.  Marland Kitchen Specimen adequacy: 05/13/2017 Comment   Final   Comment: Satisfactory for evaluation. Endocervical and/or squamous metaplastic cells (endocervical component) are present.   . Clinician Provided ICD10 05/13/2017 Comment   Final   Z12.4  . Performed by: 05/13/2017 Comment   Final   Kaylyn Lim, Cytotechnologist (ASCP)  . PAP Smear Comment  05/13/2017 .   Final  . Note: 05/13/2017 Comment   Final   Comment: The Pap smear is a screening test designed to aid in the detection of premalignant and malignant conditions of the uterine cervix.  It is not a diagnostic procedure and should not be used as the sole means of detecting cervical cancer.  Both false-positive and false-negative reports do occur.   . Test Methodology 05/13/2017 CANCELED   Final-Edited   Comment: The Thin Prep(R) Imager was unable to read this specimen.  Therefore a manual review was performed.  Result canceled by the ancillary.   Marland Kitchen HPV other hr types 05/13/2017 Negative  Negative Final  . HPV 16 05/13/2017 Negative  Negative Final  . HPV 18 05/13/2017 Negative  Negative Final   Comment: This test detects fourteen high-risk HPV types: HPV16, HPV18 and twelve other high-risk types (31, 33, 35, 39, 45, 51, 52, 56, 58, 59, 66, 68) without differentiation.   . [place] 05/13/2017 4.8  mIU/mL Final   Comment:                     Adult Female:                       Follicular phase      2.4 -  12.6  Ovulation phase      14.0 -  95.6                       Luteal phase          1.0 -  11.4                       Postmenopausal        7.7 -  58.5   . [facility] 05/13/2017 2.9  mIU/mL Final   Comment:                     Adult Female:                       Follicular phase      3.5 -  12.5                       Ovulation phase       4.7 -  21.5                       Luteal phase          1.7 -   7.7                       Postmenopausal       25.8 - 134.8   . Estradiol 05/13/2017 173.0  pg/mL Final   Comment:                     Adult Female:                       Follicular phase   09.6 -   166.0                       Ovulation phase    85.8 -   498.0                       Luteal phase       43.8 -   211.0                       Postmenopausal     <6.0 -    54.7                     Pregnancy                       1st trimester     215.0 - >4300.0                      Girls (1-10 years)    6.0 -    27.0 Roche ECLIA methodology   . Testosterone 05/13/2017 18  8 - 48 ng/dL Final  . Testosterone, Free 05/13/2017 0.7  0.0 - 4.2 pg/mL Final  . Sex Hormone Binding 05/13/2017 178.5* 24.6 - 122.0 nmol/L Final  . Progesterone 05/13/2017 4.1  ng/mL Final   Comment:                      Follicular phase       0.1 -   0.9  Luteal phase           1.8 -  23.9                      Ovulation phase        0.1 -  12.0                      Pregnant                         First trimester    11.0 -  44.3                         Second trimester   25.4 -  83.3                         Third trimester    58.7 - 214.0                      Postmenopausal         0.0 -   0.1       Imaging:  ULTRASOUND REPORT  Location: [facility] Date of Service:  05/13/2017   Indications: Menorrhagia Findings:  The uterus measures 13.3 x 11.0 x 10.0 cm. Echo texture is heterogeneous with evidence of focal masses. Within the uterus are multiple suspected fibroids.  The three most obvious measure: Fibroid 1: Left Lateral, Intramural, 2.0 x 2.0 x 1.8 cm Fibroid 2: Right, Intramural vs Submucosal, 3.2 x 3.3 x 3.1 cm Fibroid 3: Right Lateral, Intramural vs Subserosal, 5.7 x 5.5 x 5.6 cm (with calcification). Multiple fibroids with calcifications noted throughout uterus.  Only the most distinct fibroids with borders were measured. The Endometrium is difficult to delineate due to fibroids, but appears to measure 4.2 mm.  Neither ovary was visualized due to overlying bowel gas and the presence of fibroids. Survey of the adnexa demonstrates no adnexal masses. There is no free fluid in the cul de sac.  Impression: 1. Anteverted fibroid uterus appears enlarged and has an irregular contour. 2. Multiple calcified fibroids noted throughout. 3. The largest with discernable borders able to be measured is 5.7 x 5.5 x 5.6 cm. 4. Neither ovary  was visualized due to bowel gas and the presence of fibroids, however, no obvious adnexal cysts or masses were identified.  Recommendations: 1.Clinical correlation with the patient's History and Physical Exam.   Tracy Ingram Ave, RDMS   Assessment:   Fibroid uterus Menorrhagia with regular cycle Decreased libido  Plan:   1. Fibroid uterus - potential cause of heavier cycles as there has been an increase in size compared to last years ultrasound performed during an early pregnancy (which later was a failed pregnancy). Discussed management options for fibroids, including hormonal suppression of further growth with contraception or HRT, Depot Lupron , myomectomy (suboptimal as patient is not very concerned regarding future fertility), and hysterectomy (patient declines definitive management).  Desires to think over her other options before making a decision.  2. Menorrhagia with regular cycles - may be secondary to fibroid uterus, or patient's age as she may be approaching perimenopausal phase. Latest hormone levels appear normal, however noted that irregular fluctuations may be occurring.   Discussion for management included hormonal management (HRT vs contraception), Lysteda (although this treatment will have no affect on her fibroids), natural remedies that patient is  already engaging in (changing diet, exercising, natural supplements), and surgical management (endometrial ablation, hysterectomy).  Patient unsure, deciding on hormonal management, Lysteda, or continuing natural remedies.  WIll notify MD once she has made a decision.  3. Decreased libido - discussed natural remedies, as well as hormonal supplementation that could help increase libido. Patient already also engaging in exercise.  Advised on changing atmosphere/scenery of sexual encounters, erotica, toys, etc.    A total of 25 minutes were spent face-to-face with the patient during the encounter with greater than 50% dealing with  counseling and coordination of care. 3.

## 2017-05-30 NOTE — Progress Notes (Signed)
PT is doing well.

## 2017-06-01 ENCOUNTER — Encounter: Payer: Self-pay | Admitting: Obstetrics and Gynecology

## 2017-06-30 ENCOUNTER — Encounter: Payer: Self-pay | Admitting: Family

## 2017-06-30 ENCOUNTER — Telehealth: Payer: Self-pay | Admitting: Family

## 2017-06-30 ENCOUNTER — Ambulatory Visit (INDEPENDENT_AMBULATORY_CARE_PROVIDER_SITE_OTHER): Payer: 59 | Admitting: Family

## 2017-06-30 VITALS — BP 128/84 | HR 82 | Temp 98.9°F | Resp 16 | Ht 66.0 in | Wt 173.0 lb

## 2017-06-30 DIAGNOSIS — L709 Acne, unspecified: Secondary | ICD-10-CM | POA: Diagnosis not present

## 2017-06-30 DIAGNOSIS — Z0000 Encounter for general adult medical examination without abnormal findings: Secondary | ICD-10-CM

## 2017-06-30 MED ORDER — CLINDAMYCIN PHOS-BENZOYL PEROX 1-5 % EX GEL: Freq: Two times a day (BID) | CUTANEOUS | 2 refills | Status: AC

## 2017-06-30 NOTE — Telephone Encounter (Signed)
Copied from [place] [id number]. Topic: Quick Communication - See Telephone Encounter >> Jun 30, 2017  5:24 PM Neva Seat wrote: Pt is needing to reschedule the referral appt for Thyroid ultra sound on 07/03/17 (Thurs).   She states she cannont come on Thus app that was scheduled without asking her. Pt needs the appt on this Friday - anytime.

## 2017-06-30 NOTE — Assessment & Plan Note (Addendum)
Clinical breast exam performed today.  Slightly large thyroid appreciated on exam.  However this may be due to patient's body habitus.  Pap smear is up-to-date OB/GYN.   In the absence of pelvic complaints, deferred pelvic exam today.  Screening labs ordered.

## 2017-06-30 NOTE — Patient Instructions (Addendum)
Call [facility] [place], [place] Adopting a healthy lifestyle and getting preventive care can go a long way to promote health and wellness. Talk with your health care provider about what schedule of regular examinations is right for you. This is a good chance for you to check in with your provider about disease prevention and staying healthy. In between checkups, there are plenty of things you can do on your own. Experts have done a lot of research about which lifestyle changes and preventive measures are most likely to keep you healthy. Ask your health care provider for more information. Weight and diet Eat a healthy diet  Be sure to include plenty of vegetables, fruits, low-fat dairy products, and lean protein.  Do not eat a lot of foods high in solid fats, added sugars, or salt.  Get regular exercise. This is one of the most important things you can do for your health. ? Most adults should exercise for at least 150 minutes each week. The exercise should increase your heart rate and make you sweat (moderate-intensity exercise). ? Most adults should also do strengthening exercises at least twice a week. This is in addition to the moderate-intensity exercise.  Maintain a healthy weight  Body mass index (BMI) is a measurement that can be used to identify possible weight problems. It estimates body fat based on height and weight. Your health care provider can help determine your BMI and help you achieve or maintain a healthy weight.  For females 66 years of age and older: ? A BMI below 18.5 is considered underweight. ? A BMI of 18.5 to 24.9 is normal. ? A BMI of 25 to 29.9 is considered overweight. ? A BMI of 30 and above is considered obese.  Watch levels of cholesterol and blood lipids  You should start having your blood tested for lipids and cholesterol at 42 years of age, then have this test every 5 years.  You may need to have your cholesterol  levels checked more often if: ? Your lipid or cholesterol levels are high. ? You are older than 42 years of age. ? You are at high risk for heart disease.  Cancer screening Lung Cancer  Lung cancer screening is recommended for adults 53-81 years old who are at high risk for lung cancer because of a history of smoking.  A yearly low-dose CT scan of the lungs is recommended for people who: ? Currently smoke. ? Have quit within the past 15 years. ? Have at least a 30-pack-year history of smoking. A pack year is smoking an average of one pack of cigarettes a day for 1 year.  Yearly screening should continue until it has been 15 years since you quit.  Yearly screening should stop if you develop a health problem that would prevent you from having lung cancer treatment.  Breast Cancer  Practice breast self-awareness. This means understanding how your breasts normally appear and feel.  It also means doing regular breast self-exams. Let your health care provider know about any changes, no matter how small.  If you are in your 20s or 30s, you should have a clinical breast exam (CBE) by a health care provider every 1-3 years as part of a regular health exam.  If you are 41 or older, have a CBE every year. Also consider having a breast X-ray (mammogram) every year.  If you have a family history of breast cancer, talk to your health care provider about  genetic screening.  If you are at high risk for breast cancer, talk to your health care provider about having an MRI and a mammogram every year.  Breast cancer gene (BRCA) assessment is recommended for women who have family members with BRCA-related cancers. BRCA-related cancers include: ? Breast. ? Ovarian. ? Tubal. ? Peritoneal cancers.  Results of the assessment will determine the need for genetic counseling and BRCA1 and BRCA2 testing.  Cervical Cancer Your health care provider may recommend that you be screened regularly for cancer of  the pelvic organs (ovaries, uterus, and vagina). This screening involves a pelvic examination, including checking for microscopic changes to the surface of your cervix (Pap test). You may be encouraged to have this screening done every 3 years, beginning at age 98.  For women ages 40-65, health care providers may recommend pelvic exams and Pap testing every 3 years, or they may recommend the Pap and pelvic exam, combined with testing for human papilloma virus (HPV), every 5 years. Some types of HPV increase your risk of cervical cancer. Testing for HPV may also be done on women of any age with unclear Pap test results.  Other health care providers may not recommend any screening for nonpregnant women who are considered low risk for pelvic cancer and who do not have symptoms. Ask your health care provider if a screening pelvic exam is right for you.  If you have had past treatment for cervical cancer or a condition that could lead to cancer, you need Pap tests and screening for cancer for at least 20 years after your treatment. If Pap tests have been discontinued, your risk factors (such as having a new sexual partner) need to be reassessed to determine if screening should resume. Some women have medical problems that increase the chance of getting cervical cancer. In these cases, your health care provider may recommend more frequent screening and Pap tests.  Colorectal Cancer  This type of cancer can be detected and often prevented.  Routine colorectal cancer screening usually begins at 42 years of age and continues through 42 years of age.  Your health care provider may recommend screening at an earlier age if you have risk factors for colon cancer.  Your health care provider may also recommend using home test kits to check for hidden blood in the stool.  A small camera at the end of a tube can be used to examine your colon directly (sigmoidoscopy or colonoscopy). This is done to check for the  earliest forms of colorectal cancer.  Routine screening usually begins at age 28.  Direct examination of the colon should be repeated every 5-10 years through 42 years of age. However, you may need to be screened more often if early forms of precancerous polyps or small growths are found.  Skin Cancer  Check your skin from head to toe regularly.  Tell your health care provider about any new moles or changes in moles, especially if there is a change in a mole's shape or color.  Also tell your health care provider if you have a mole that is larger than the size of a Orsak eraser.  Always use sunscreen. Apply sunscreen liberally and repeatedly throughout the day.  Protect yourself by wearing long sleeves, pants, a wide-brimmed hat, and sunglasses whenever you are outside.  Heart disease, diabetes, and high blood pressure  High blood pressure causes heart disease and increases the risk of stroke. High blood pressure is more likely to develop in: ? People  who have blood pressure in the high end of the normal range (130-139/85-89 mm Hg). ? People who are overweight or obese. ? People who are African American.  If you are 5-22 years of age, have your blood pressure checked every 3-5 years. If you are 69 years of age or older, have your blood pressure checked every year. You should have your blood pressure measured twice-once when you are at a hospital or clinic, and once when you are not at a hospital or clinic. Record the average of the two measurements. To check your blood pressure when you are not at a hospital or clinic, you can use: ? An automated blood pressure machine at a pharmacy. ? A home blood pressure monitor.  If you are between 29 years and 27 years old, ask your health care provider if you should take aspirin to prevent strokes.  Have regular diabetes screenings. This involves taking a blood sample to check your fasting blood sugar level. ? If you are at a normal weight and  have a low risk for diabetes, have this test once every three years after 42 years of age. ? If you are overweight and have a high risk for diabetes, consider being tested at a younger age or more often. Preventing infection Hepatitis B  If you have a higher risk for hepatitis B, you should be screened for this virus. You are considered at high risk for hepatitis B if: ? You were born in a country where hepatitis B is common. Ask your health care provider which countries are considered high risk. ? Your parents were born in a high-risk country, and you have not been immunized against hepatitis B (hepatitis B vaccine). ? You have HIV or AIDS. ? You use needles to inject street drugs. ? You live with someone who has hepatitis B. ? You have had sex with someone who has hepatitis B. ? You get hemodialysis treatment. ? You take certain medicines for conditions, including cancer, organ transplantation, and autoimmune conditions.  Hepatitis C  Blood testing is recommended for: ? Everyone born from [birth year] through 1965. ? Anyone with known risk factors for hepatitis C.  Sexually transmitted infections (STIs)  You should be screened for sexually transmitted infections (STIs) including gonorrhea and chlamydia if: ? You are sexually active and are younger than 42 years of age. ? You are older than 42 years of age and your health care provider tells you that you are at risk for this type of infection. ? Your sexual activity has changed since you were last screened and you are at an increased risk for chlamydia or gonorrhea. Ask your health care provider if you are at risk.  If you do not have HIV, but are at risk, it may be recommended that you take a prescription medicine daily to prevent HIV infection. This is called pre-exposure prophylaxis (PrEP). You are considered at risk if: ? You are sexually active and do not regularly use condoms or know the HIV status of your partner(s). ? You take drugs by  injection. ? You are sexually active with a partner who has HIV.  Talk with your health care provider about whether you are at high risk of being infected with HIV. If you choose to begin PrEP, you should first be tested for HIV. You should then be tested every 3 months for as long as you are taking PrEP. Pregnancy  If you are premenopausal and you may become pregnant, ask your health care  provider about preconception counseling.  If you may become pregnant, take 400 to 800 micrograms (mcg) of folic acid every day.  If you want to prevent pregnancy, talk to your health care provider about birth control (contraception). Osteoporosis and menopause  Osteoporosis is a disease in which the bones lose minerals and strength with aging. This can result in serious bone fractures. Your risk for osteoporosis can be identified using a bone density scan.  If you are 35 years of age or older, or if you are at risk for osteoporosis and fractures, ask your health care provider if you should be screened.  Ask your health care provider whether you should take a calcium or vitamin D supplement to lower your risk for osteoporosis.  Menopause may have certain physical symptoms and risks.  Hormone replacement therapy may reduce some of these symptoms and risks. Talk to your health care provider about whether hormone replacement therapy is right for you. Follow these instructions at home:  Schedule regular health, dental, and eye exams.  Stay current with your immunizations.  Do not use any tobacco products including cigarettes, chewing tobacco, or electronic cigarettes.  If you are pregnant, do not drink alcohol.  If you are breastfeeding, limit how much and how often you drink alcohol.  Limit alcohol intake to no more than 1 drink per day for nonpregnant women. One drink equals 12 ounces of beer, 5 ounces of wine, or 1 ounces of hard liquor.  Do not use street drugs.  Do not share needles.  Ask  your health care provider for help if you need support or information about quitting drugs.  Tell your health care provider if you often feel depressed.  Tell your health care provider if you have ever been abused or do not feel safe at home. This information is not intended to replace advice given to you by your health care provider. Make sure you discuss any questions you have with your health care provider. Document Released: 10/22/2010 Document Revised: 09/14/2015 Document Reviewed: 01/10/2015 Elsevier Interactive Patient Education  [employer].

## 2017-06-30 NOTE — Progress Notes (Signed)
Subjective:    Patient ID: Tracy Ingram, female    DOB: 1975/07/13, 42 y.o.   MRN: [id number]  CC: Tracy Ingram is a 42 y.o. female who presents today for physical exam.    HPI: Complains of acne for many years. Would like to try a gel. Using salicyclic acid, benzoyl peroxide OTC with some relief. Has seen dermatology in past.   History of hypothyroidism and patient would like this checked today.  She also complains of occasional cold intolerance, changes to her hair. She also had a full workup of iron for anemia.  No shortness of breath, fatigue      Colorectal Cancer Screening: no early family history Breast Cancer Screening: Mammogram UTD, normal, 2019 Cervical Cancer Screening: UTD, 05/13/2017 with dr Marcelline Mates Bone Health screening/DEXA for 65+: No increased fracture risk. Defer screening at this time. Lung Cancer Screening: Doesn't have 30 year pack year history and age > 52 years. Immunizations       Tetanus - utd     Labs: Screening labs today. Exercise: Gets regular exercise.  Alcohol use: Very rare Smoking/tobacco use: Nonsmoker.  Regular dental exams: UTD Wears seat belt: Yes. Skin: has seen a couple of dermatologists, last seen [place] in [place]. No h/o skin cancer.   HISTORY:  Past Medical History:  Diagnosis Date  . Fibroid   . History of miscarriage   . Seasonal allergies     Past Surgical History:  Procedure Laterality Date  . EYE SURGERY  2015   had nystagmus per patient  . MYOMECTOMY  2013   Family History  Problem Relation Age of Onset  . Fibroids Maternal Aunt   . Thyroid disease Mother   . Breast cancer Neg Hx   . Colon cancer Neg Hx   . Ovarian cancer Neg Hx       ALLERGIES: Sulfa antibiotics  Current Outpatient Medications on File Prior to Visit  Medication Sig Dispense Refill  . Multiple Vitamin (MULTIVITAMIN) tablet Take 1 tablet by mouth daily.     No current facility-administered medications on file prior to visit.      Social History   Tobacco Use  . Smoking status: Never Smoker  . Smokeless tobacco: Never Used  Substance Use Topics  . Alcohol use: Yes    Comment: rare  . Drug use: No    Review of Systems  Constitutional: Negative for chills, fever and unexpected weight change.  HENT: Negative for congestion.   Respiratory: Negative for cough.   Cardiovascular: Negative for chest pain, palpitations and leg swelling.  Gastrointestinal: Negative for nausea and vomiting.  Endocrine: Positive for cold intolerance. Negative for heat intolerance.  Musculoskeletal: Negative for arthralgias and myalgias.  Skin: Negative for rash.  Neurological: Negative for headaches.  Hematological: Negative for adenopathy.  Psychiatric/Behavioral: Negative for confusion.      Objective:    BP 128/84 (BP Location: Left Arm, Patient Position: Sitting, Cuff Size: Normal)   Pulse 82   Temp 98.9 F (37.2 C) (Oral)   Resp 16   Ht 5\' 6"  (1.676 m)   Wt 173 lb (78.5 kg)   LMP 06/10/2017   SpO2 99%   BMI 27.92 kg/m   BP Readings from Last 3 Encounters:  06/30/17 128/84  05/30/17 124/74  05/13/17 123/74   Wt Readings from Last 3 Encounters:  06/30/17 173 lb (78.5 kg)  05/30/17 171 lb 12.8 oz (77.9 kg)  05/13/17 174 lb 3.2 oz (79 kg)    Physical Exam  Constitutional: She appears well-developed and well-nourished.  Eyes: Conjunctivae are normal.  Neck: Thyromegaly present. No thyroid mass present.  Appreciated slightly enlarged gland  Cardiovascular: Normal rate, regular rhythm, normal heart sounds and normal pulses.  Pulmonary/Chest: Effort normal and breath sounds normal. She has no wheezes. She has no rhonchi. She has no rales. Right breast exhibits no inverted nipple, no mass, no nipple discharge, no skin change and no tenderness. Left breast exhibits no inverted nipple, no mass, no nipple discharge, no skin change and no tenderness. Breasts are symmetrical.  CBE performed.   Lymphadenopathy:        Head (right side): No submental, no submandibular, no tonsillar, no preauricular, no posterior auricular and no occipital adenopathy present.       Head (left side): No submental, no submandibular, no tonsillar, no preauricular, no posterior auricular and no occipital adenopathy present.    She has no cervical adenopathy.       Right cervical: No superficial cervical, no deep cervical and no posterior cervical adenopathy present.      Left cervical: No superficial cervical, no deep cervical and no posterior cervical adenopathy present.    She has no axillary adenopathy.  Neurological: She is alert.  Skin: Skin is warm and dry.  Psychiatric: She has a normal mood and affect. Her speech is normal and behavior is normal. Thought content normal.  Vitals reviewed.      Assessment & Plan:   Problem List Items Addressed This Visit      Musculoskeletal and Integument   Acne    Chronic.Patient will call dermatology and phone number given on AVS. Abx gel given for prn use.       Relevant Medications   clindamycin-benzoyl peroxide (BENZACLIN) gel     Other   Routine physical examination - Primary    Clinical breast exam performed today.  Slightly large thyroid appreciated on exam.  However this may be due to patient's body habitus.  Pap smear is up-to-date OB/GYN.   In the absence of pelvic complaints, deferred pelvic exam today.  Screening labs ordered.      Relevant Medications   clindamycin-benzoyl peroxide (BENZACLIN) gel   Other Relevant Orders   CBC with Differential/Platelet   Comprehensive metabolic panel   Hemoglobin A1c   Lipid panel   TSH   VITAMIN D 25 Hydroxy (Vit-D Deficiency, Fractures)   Ferritin   IBC panel   Microalbumin / creatinine urine ratio   US THYROID       I am having Tracy Ingram start on clindamycin-benzoyl peroxide. I am also having her maintain her multivitamin.   Meds ordered this encounter  Medications  . clindamycin-benzoyl peroxide  (BENZACLIN) gel    Sig: Apply topically 2 (two) times daily.    Dispense:  35 g    Refill:  2    Order Specific Question:   Supervising Provider    Answer:   Crecencio Mc [[id number]]    Return precautions given.   Risks, benefits, and alternatives of the medications and treatment plan prescribed today were discussed, and patient expressed understanding.   Education regarding symptom management and diagnosis given to patient on AVS.   Continue to follow with Burnard Hawthorne, FNP for routine health maintenance.   Tracy Ingram and I agreed with plan.   Mable Paris, FNP

## 2017-06-30 NOTE — Telephone Encounter (Signed)
Copied from [place] [id number]. Topic: Quick Communication - Rx Refill/Question >> Jun 30, 2017  5:47 PM Neva Seat wrote: clindamycin-benzoyl peroxide (BENZACLIN) gel 1-5% pump  Insurance is needing a prior aurth before the Rx can be filled.   [place], [place] [address] Phone: [phone] Fax: [phone]

## 2017-06-30 NOTE — Assessment & Plan Note (Signed)
Chronic.Patient will call dermatology and phone number given on AVS. Abx gel given for prn use.

## 2017-07-01 LAB — COMPREHENSIVE METABOLIC PANEL
ALBUMIN: 4.2 g/dL (ref 3.5–5.2)
ALK PHOS: 29 U/L — AB (ref 39–117)
ALT: 12 U/L (ref 0–35)
AST: 13 U/L (ref 0–37)
BILIRUBIN TOTAL: 0.4 mg/dL (ref 0.2–1.2)
BUN: 9 mg/dL (ref 6–23)
CALCIUM: 9.1 mg/dL (ref 8.4–10.5)
CO2: 27 mEq/L (ref 19–32)
CREATININE: 0.71 mg/dL (ref 0.40–1.20)
Chloride: 102 mEq/L (ref 96–112)
GFR: 116.44 mL/min (ref >60.00)
Glucose, Bld: 79 mg/dL (ref 70–99)
Potassium: 3.9 mEq/L (ref 3.5–5.1)
SODIUM: 135 meq/L (ref 135–145)
TOTAL PROTEIN: 6.8 g/dL (ref 6.0–8.3)

## 2017-07-01 LAB — IBC PANEL
IRON: 82 ug/dL (ref 42–145)
Saturation Ratios: 23.3 % (ref 20.0–50.0)
Transferrin: 251 mg/dL (ref 212.0–360.0)

## 2017-07-01 LAB — CBC WITH DIFFERENTIAL/PLATELET
Basophils Absolute: 0 10*3/uL (ref 0.0–0.1)
Basophils Relative: 0.6 % (ref 0.0–3.0)
EOS ABS: 0.2 10*3/uL (ref 0.0–0.7)
EOS PCT: 2.8 % (ref 0.0–5.0)
HEMATOCRIT: 37.6 % (ref 36.0–46.0)
HEMOGLOBIN: 12.5 g/dL (ref 12.0–15.0)
LYMPHS PCT: 37.1 % (ref 12.0–46.0)
Lymphs Abs: 2 10*3/uL (ref 0.7–4.0)
MCHC: 33.2 g/dL (ref 30.0–36.0)
MCV: 84 fl (ref 78.0–100.0)
Monocytes Absolute: 0.5 10*3/uL (ref 0.1–1.0)
Monocytes Relative: 8.4 % (ref 3.0–12.0)
Neutro Abs: 2.8 10*3/uL (ref 1.4–7.7)
Neutrophils Relative %: 51.1 % (ref 43.0–77.0)
Platelets: 217 10*3/uL (ref 150.0–400.0)
RBC: 4.48 Mil/uL (ref 3.87–5.11)
RDW: 13.7 % (ref 11.5–15.5)
WBC: 5.5 10*3/uL (ref 4.0–10.5)

## 2017-07-01 LAB — MICROALBUMIN / CREATININE URINE RATIO
CREATININE, U: 20.3 mg/dL
Microalb Creat Ratio: 3.4 mg/g (ref 0.0–30.0)
Microalb, Ur: <0.7 mg/dL (ref 0.0–1.9)

## 2017-07-01 LAB — TSH: TSH: 0.87 u[IU]/mL (ref 0.35–4.50)

## 2017-07-01 LAB — LIPID PANEL
CHOLESTEROL: 134 mg/dL (ref 0–200)
HDL: 71.1 mg/dL (ref >39.00)
LDL Cholesterol: 54 mg/dL (ref 0–99)
NONHDL: 62.46
Total CHOL/HDL Ratio: 2
Triglycerides: 43 mg/dL (ref 0.0–149.0)
VLDL: 8.6 mg/dL (ref 0.0–40.0)

## 2017-07-01 LAB — FERRITIN: FERRITIN: 14.7 ng/mL (ref 10.0–291.0)

## 2017-07-01 LAB — HEMOGLOBIN A1C: Hgb A1c MFr Bld: 5.5 % (ref 4.6–6.5)

## 2017-07-01 LAB — VITAMIN D 25 HYDROXY (VIT D DEFICIENCY, FRACTURES): VITD: 27.98 ng/mL — AB (ref 30.00–100.00)

## 2017-07-03 ENCOUNTER — Ambulatory Visit: Payer: 59

## 2017-07-04 ENCOUNTER — Ambulatory Visit
Admission: RE | Admit: 2017-07-04 | Discharge: 2017-07-04 | Disposition: A | Discharge status: Home / Self Care | Payer: 59 | Source: Ambulatory Visit | Attending: Family | Admitting: Family

## 2017-07-04 DIAGNOSIS — E010 Iodine-deficiency related diffuse (endemic) goiter: Secondary | ICD-10-CM | POA: Diagnosis not present

## 2017-07-04 DIAGNOSIS — E042 Nontoxic multinodular goiter: Secondary | ICD-10-CM | POA: Insufficient documentation

## 2017-07-04 DIAGNOSIS — Z0000 Encounter for general adult medical examination without abnormal findings: Secondary | ICD-10-CM | POA: Insufficient documentation

## 2017-07-04 DIAGNOSIS — E049 Nontoxic goiter, unspecified: Secondary | ICD-10-CM | POA: Diagnosis present

## 2017-07-08 NOTE — Telephone Encounter (Signed)
Prior submitted via cover my meds PA [id number]

## 2017-07-11 NOTE — Telephone Encounter (Signed)
Patient calling because the pharmacy told her the PA for clindamycin-benzoyl peroxide (BENZACLIN) gel has been denied. Patient would like an alternative script sent in.

## 2017-07-14 NOTE — Telephone Encounter (Signed)
Pharmacy called and the Benzaclin was approved in the jar. Pump was denied per Insurance determination. Pharmacy will fill the medication in jar.

## 2017-07-14 NOTE — Telephone Encounter (Signed)
Please substitue per pa

## 2017-07-14 NOTE — Telephone Encounter (Signed)
I see where a prior auth was completed. FYI.

## 2017-07-14 NOTE — Telephone Encounter (Signed)
PA denied can we call something else in besides the Benzaclin?

## 2017-07-16 NOTE — Telephone Encounter (Signed)
noted 

## 2017-10-27 ENCOUNTER — Ambulatory Visit: Payer: 59 | Admitting: Family Medicine

## 2017-10-27 ENCOUNTER — Encounter: Payer: Self-pay | Admitting: Family Medicine

## 2017-10-27 VITALS — BP 136/76 | HR 80 | Temp 98.5°F | Ht 66.0 in | Wt 165.0 lb

## 2017-10-27 DIAGNOSIS — B373 Candidiasis of vulva and vagina: Secondary | ICD-10-CM | POA: Diagnosis not present

## 2017-10-27 DIAGNOSIS — J22 Unspecified acute lower respiratory infection: Secondary | ICD-10-CM

## 2017-10-27 DIAGNOSIS — B3731 Acute candidiasis of vulva and vagina: Secondary | ICD-10-CM

## 2017-10-27 MED ORDER — AMOXICILLIN 875 MG PO TABS: 875.0000 mg | ORAL_TABLET | Freq: Two times a day (BID) | ORAL | 0 refills | Status: AC

## 2017-10-27 MED ORDER — FLUCONAZOLE 150 MG PO TABS: 150.0000 mg | ORAL_TABLET | Freq: Once | ORAL | 0 refills | Status: AC

## 2017-10-27 NOTE — Progress Notes (Signed)
Subjective:    Patient ID: Tracy Ingram, female    DOB: 05-15-1975, 42 y.o.   MRN: [id number]  HPI This is a 42 yo female who presents today with eye drainage and sinus problems x 1 week. Dark green mucus in eyes today and yesterday when she woke up. Some nasal drainage, no headaches, no body aches, cough with green phlegm. Was feeling better, now is feeling worse and is hoarse. No fever. Some fatigue. No SOB or wheeze.  Has been taking Mucinex, Tylenol am/pm- little relief.   Past Medical History:  Diagnosis Date  . Fibroid   . History of miscarriage   . Seasonal allergies    Past Surgical History:  Procedure Laterality Date  . EYE SURGERY  2015   had nystagmus per patient  . MYOMECTOMY  2013   Family History  Problem Relation Age of Onset  . Fibroids Maternal Aunt   . Thyroid disease Mother   . Breast cancer Neg Hx   . Colon cancer Neg Hx   . Ovarian cancer Neg Hx    Social History   Tobacco Use  . Smoking status: Never Smoker  . Smokeless tobacco: Never Used  Substance Use Topics  . Alcohol use: Yes    Comment: rare  . Drug use: No      Review of Systems Per HPI    Objective:   Physical Exam  Constitutional: She is oriented to person, place, and time. She appears well-developed and well-nourished. She appears ill. No distress.  HENT:  Head: Normocephalic and atraumatic.  Right Ear: Tympanic membrane, external ear and ear canal normal.  Left Ear: Tympanic membrane, external ear and ear canal normal.  Nose: Mucosal edema present.  Mouth/Throat: Uvula is midline and mucous membranes are normal. Posterior oropharyngeal erythema present. No oropharyngeal exudate or posterior oropharyngeal edema.  Sounds congested.   Eyes:  Mildly injected conjunctiva. No drainage presently.   Neck: Normal range of motion. Neck supple.  Cardiovascular: Normal rate, regular rhythm and normal heart sounds.  Pulmonary/Chest: Effort normal and breath sounds normal.    Frequent, dry cough.   Lymphadenopathy:    She has no cervical adenopathy.  Neurological: She is alert and oriented to person, place, and time.  Skin: Skin is warm and dry. She is not diaphoretic.  Psychiatric: She has a normal mood and affect. Her behavior is normal. Judgment and thought content normal.  Vitals reviewed.    BP 136/76 (BP Location: Right Arm, Patient Position: Sitting, Cuff Size: Normal)   Pulse 80   Temp 98.5 F (36.9 C) (Oral)   Ht 5\' 6"  (1.676 m)   Wt 165 lb (74.8 kg)   SpO2 98%   BMI 26.63 kg/m  Wt Readings from Last 3 Encounters:  10/27/17 165 lb (74.8 kg)  06/30/17 173 lb (78.5 kg)  05/30/17 171 lb 12.8 oz (77.9 kg)    Assessment & Plan:  1. Lower respiratory infection - Provided written and verbal information regarding diagnosis and treatment. - RTC precautions reviewed - amoxicillin (AMOXIL) 875 MG tablet; Take 1 tablet (875 mg total) by mouth 2 (two) times daily.  Dispense: 14 tablet; Refill: 0  2. Vaginal yeast infection - patient requests treatment for antibiotic induced vaginal yeast - fluconazole (DIFLUCAN) 150 MG tablet; Take 1 tablet (150 mg total) by mouth once for 1 dose.  Dispense: 1 tablet; Refill: 0   Clarene Reamer, FNP-BC  [facility]  10/29/2017  7:48 AM

## 2017-10-27 NOTE — Patient Instructions (Signed)
OTC antihistamine eye drop  Good to see you today  For nasal congestion you can use Afrin nasal spray for 3 days max, Sudafed, saline nasal spray (generic is fine for all). For cough you can try Delsym. Drink enough fluids to make your urine light yellow. For fever/chill/muscle aches you can take over the counter acetaminophen or ibuprofen.  Please come back in if you are not better in 5-7 days or if you develop wheezing, shortness of breath or persistent vomiting.

## 2017-10-28 ENCOUNTER — Encounter: Payer: Self-pay | Admitting: Emergency Medicine

## 2017-10-28 ENCOUNTER — Encounter: Payer: Self-pay | Admitting: Family Medicine

## 2017-10-28 ENCOUNTER — Telehealth: Payer: Self-pay | Admitting: Family

## 2017-10-28 NOTE — Telephone Encounter (Signed)
Letter was created, printed, and placed up front for pick up. Patient aware nothing further needed at this time.

## 2017-10-28 NOTE — Telephone Encounter (Signed)
Copied from [place] [id number]. Topic: Quick Communication - See Telephone Encounter >> Oct 28, 2017  3:21 PM Rutherford Nail, [place] wrote: CRM for notification. See Telephone encounter for: 10/28/17. Patient calling and states that she saw Clarene Reamer on 10/27/17. She is requesting a work note for today and tomorrow. Please advise. Patient would like a call when this is ready for pick up. CB#: [phone]

## 2017-10-28 NOTE — Telephone Encounter (Signed)
OK for her to be off today and tomorrow. Please provide her a note and let her know when it is ready per her request.

## 2017-10-29 ENCOUNTER — Encounter: Payer: Self-pay | Admitting: Family Medicine

## 2020-01-23 IMAGING — US US THYROID
1 series · 13 of 25 positions shown · non-contrast
Comparison: None.

CLINICAL DATA: Palpable abnormality.  Thyromegaly

EXAM:
THYROID ULTRASOUND
TECHNIQUE: Ultrasound examination of the thyroid gland and adjacent soft
tissues was performed.

[Series 1: us thyroid · 0.08mm/px · 13 of 47 slices shown]
[im 1/47]
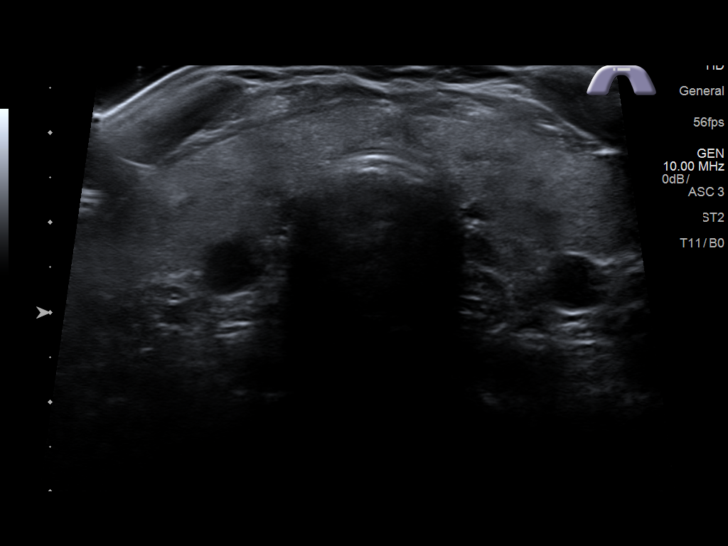
[im 4/47]
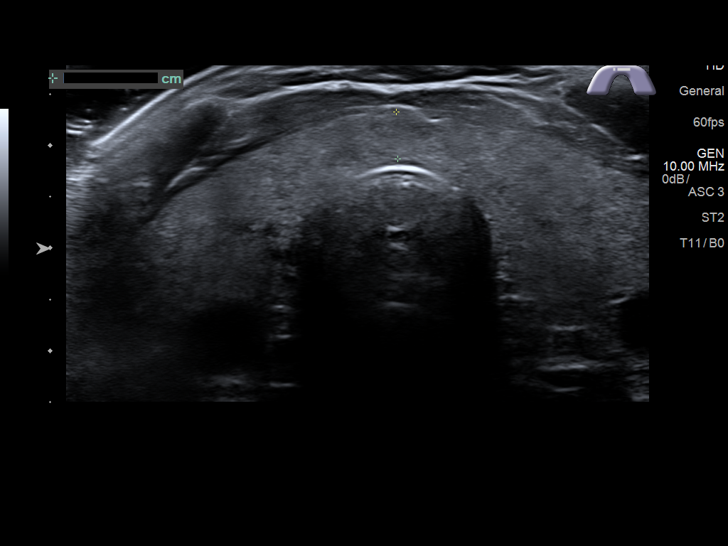
[im 8/47]
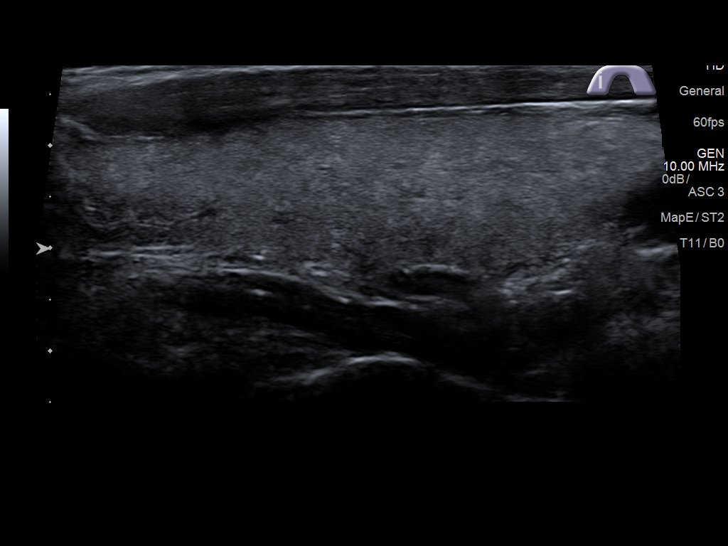
[im 12/47]
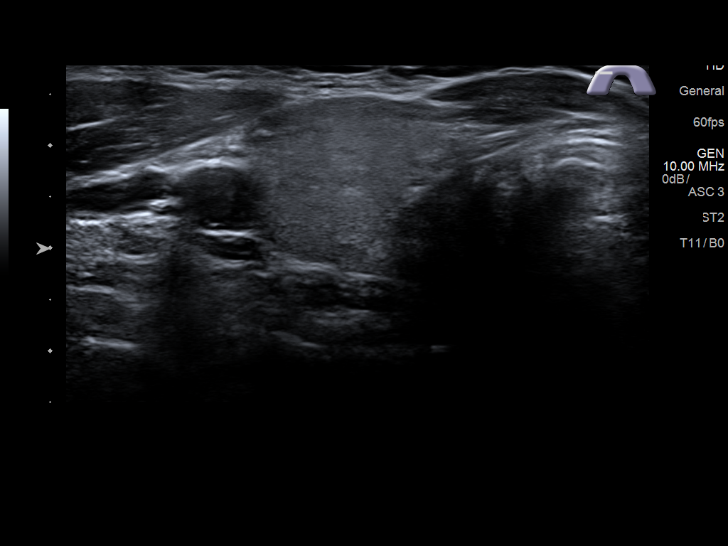
[im 16/47]
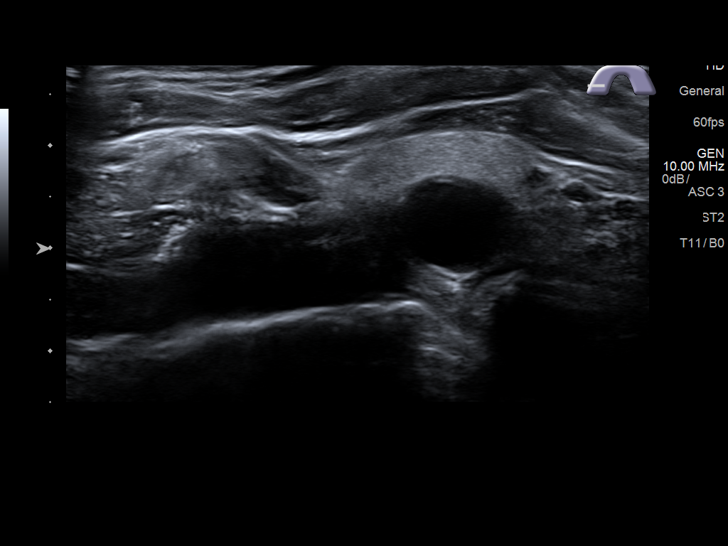
[im 20/47]
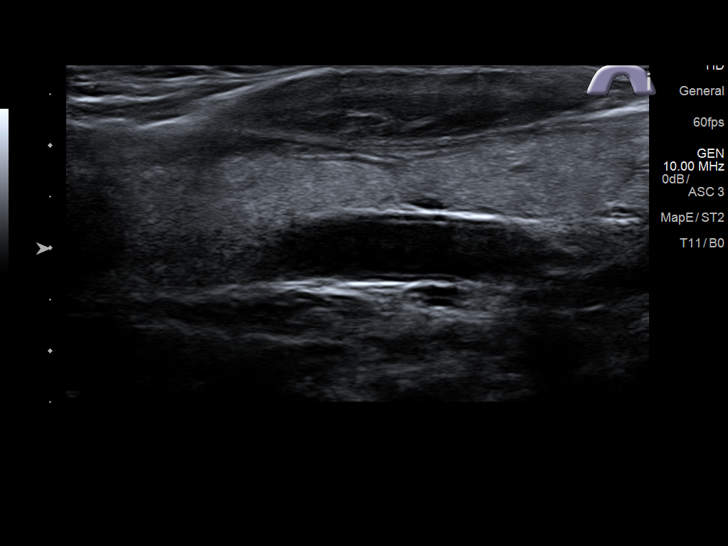
[im 24/47]
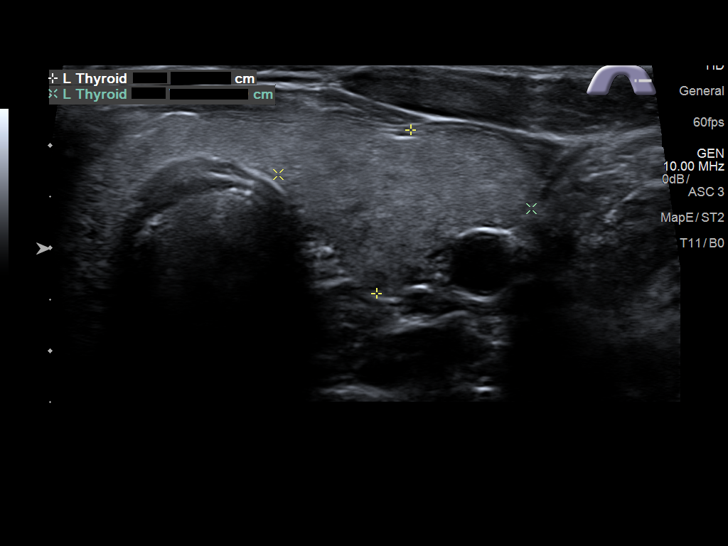
[im 27/47]
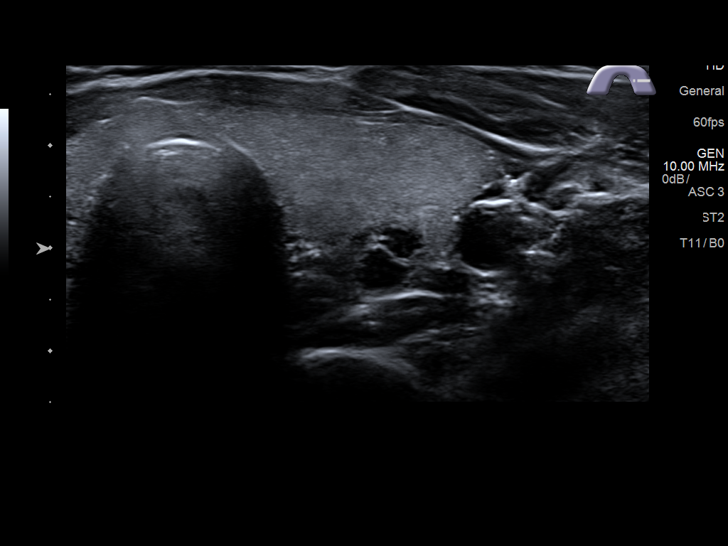
[im 31/47]
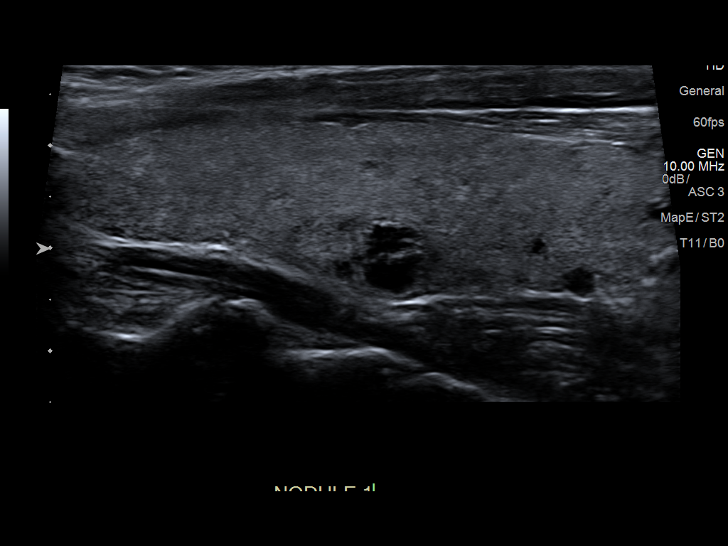
[im 35/47]
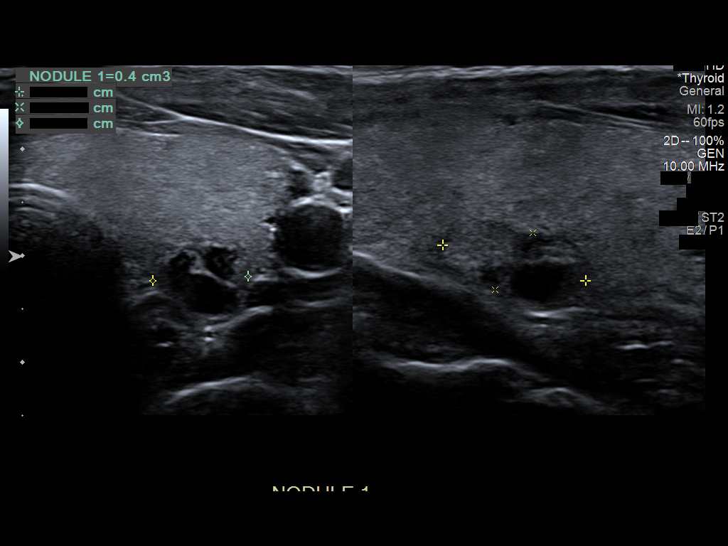
[im 39/47]
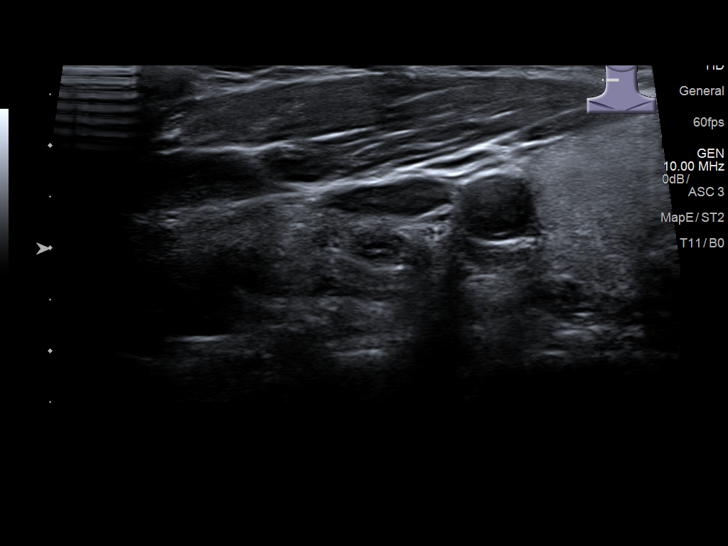
[im 43/47]
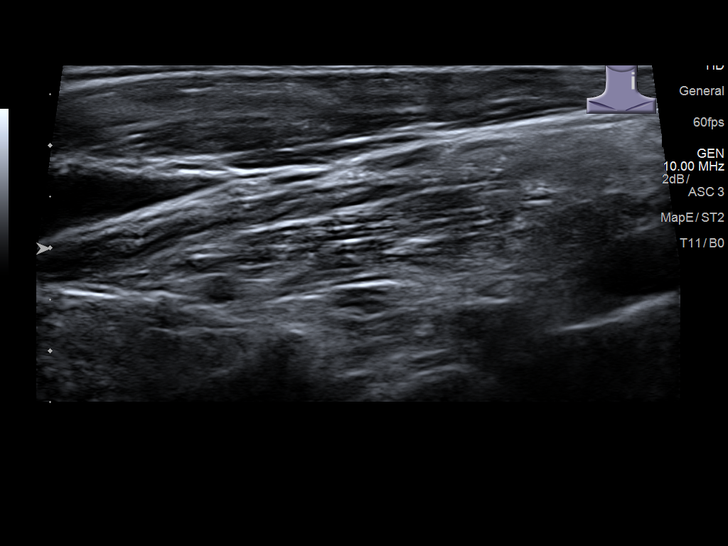
[im 47/47]
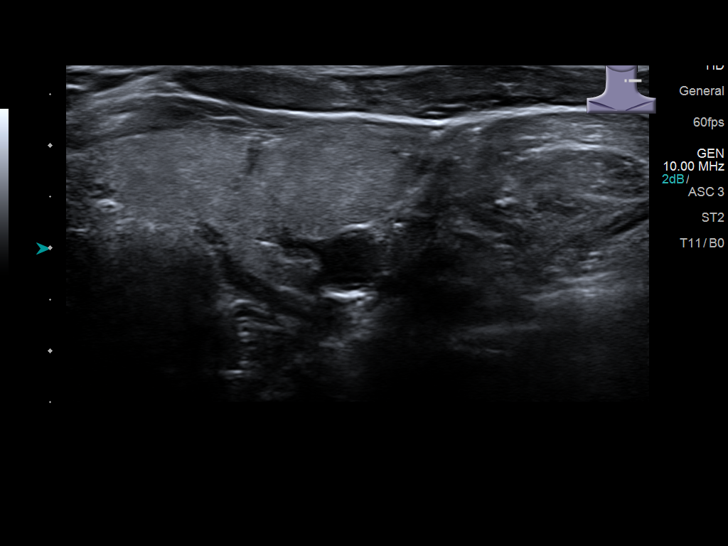

[13 of 25 positions shown; findings below may reference images not displayed]

FINDINGS: Parenchymal Echotexture: Mildly heterogenous - no definitive
glandular hyperemia

Isthmus: Normal in size measures 0.5 cm in diameter

Right lobe: Enlarged measuring 6.2 x 2.1 x 1.9 cm

Left lobe: Enlarged measuring 6.0 x 2.5 x 1.6 cm

_________________________________________________________

Estimated total number of nodules >/= 1 cm: 1

Number of spongiform nodules >/=  2 cm not described below (TR1): 0

Number of mixed cystic and solid nodules >/= 1.5 cm not described
below (TR2): 0

_________________________________________________________

There are 2 adjacent punctate (sub 8 mm) nodules within the mid,
medial aspect the left lobe of the thyroid, 1 of which appears
predominantly cystic while the other which appears predominantly
solid and isoechoic, however neither of which meet imaging criteria
to recommend percutaneous sampling or continued dedicated follow-up.
IMPRESSION: 1. Mildly heterogeneous appearing enlarged thyroid gland without
discrete worrisome thyroid nodule.
2. Two adjacent punctate (sub 8 mm) left-sided thyroid nodules,
neither of which meet imaging criteria to recommend percutaneous
sampling or continued dedicated follow-up.

The above is in keeping with the ACR TI-RADS recommendations - [HOSPITAL] 5606;[DATE].
